# Patient Record
Sex: Male | Born: 1995 | Hispanic: Yes | Marital: Single | Smoking: Former smoker
Health system: Southern US, Community
[De-identification: ages and names within clinical notes are randomized; demographics above are authoritative.]

## PROBLEM LIST (undated history)

## (undated) DIAGNOSIS — K219 Gastro-esophageal reflux disease without esophagitis: Secondary | ICD-10-CM

## (undated) HISTORY — DX: Gastro-esophageal reflux disease without esophagitis: K21.9

---
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
## 2016-02-21 ENCOUNTER — Emergency Department
Admission: EM | Admit: 2016-02-21 | Discharge: 2016-02-21 | Disposition: A | Discharge status: Home / Self Care | Payer: Medicaid Other | Attending: Emergency Medicine | Admitting: Emergency Medicine

## 2016-02-21 ENCOUNTER — Encounter: Payer: Self-pay | Admitting: Emergency Medicine

## 2016-02-21 DIAGNOSIS — Z79899 Other long term (current) drug therapy: Secondary | ICD-10-CM | POA: Diagnosis not present

## 2016-02-21 DIAGNOSIS — J40 Bronchitis, not specified as acute or chronic: Secondary | ICD-10-CM | POA: Diagnosis not present

## 2016-02-21 DIAGNOSIS — R05 Cough: Secondary | ICD-10-CM | POA: Diagnosis present

## 2016-02-21 DIAGNOSIS — F17200 Nicotine dependence, unspecified, uncomplicated: Secondary | ICD-10-CM | POA: Insufficient documentation

## 2016-02-21 DIAGNOSIS — J069 Acute upper respiratory infection, unspecified: Secondary | ICD-10-CM | POA: Insufficient documentation

## 2016-02-21 DIAGNOSIS — Z792 Long term (current) use of antibiotics: Secondary | ICD-10-CM | POA: Insufficient documentation

## 2016-02-21 DIAGNOSIS — B9789 Other viral agents as the cause of diseases classified elsewhere: Secondary | ICD-10-CM

## 2016-02-21 MED ORDER — ALBUTEROL SULFATE HFA 108 (90 BASE) MCG/ACT IN AERS: 2.0000 | INHALATION_SPRAY | Freq: Four times a day (QID) | RESPIRATORY_TRACT | 0 refills | Status: DC | PRN

## 2016-02-21 MED ORDER — FLUTICASONE PROPIONATE 50 MCG/ACT NA SUSP: 2.0000 | Freq: Every day | NASAL | 0 refills | Status: DC

## 2016-02-21 MED ORDER — DEXAMETHASONE SODIUM PHOSPHATE 10 MG/ML IJ SOLN
10.0000 mg | Freq: Once | INTRAMUSCULAR | Status: AC
  Administered 2016-02-21: 10 mg via INTRAMUSCULAR
  Filled 2016-02-21: qty 1

## 2016-02-21 MED ORDER — ALBUTEROL SULFATE (2.5 MG/3ML) 0.083% IN NEBU
5.0000 mg | INHALATION_SOLUTION | Freq: Once | RESPIRATORY_TRACT | Status: AC
  Administered 2016-02-21: 5 mg via RESPIRATORY_TRACT
  Filled 2016-02-21: qty 6

## 2016-02-21 MED ORDER — BENZONATATE 100 MG PO CAPS: 100.0000 mg | ORAL_CAPSULE | Freq: Three times a day (TID) | ORAL | 0 refills | Status: DC | PRN

## 2016-02-21 NOTE — ED Notes (Signed)

## 2016-02-21 NOTE — ED Provider Notes (Signed)
[facility] Emergency Department Provider Note ____________________________________________  Time seen: 2248  I have reviewed the triage vital signs and the nursing notes.  HISTORY  Chief Complaint  Cough and Nasal Congestion  HPI Maxwell Franco is a 20 y.o. male resents to the ED for evaluation of intermittently productive cough and nasal condition for the last 2-3 days. Patient denies any interim fevers, chills, sweats. Denies any sick contacts or recent travel. He described primarily an itchy throat which leads to a cough. He describes also some postnasal drainage but denies any outright sore throat. He describes an intermittent cough with some audible wheezing and mild shortness of breath. He has in the past had an albuterol inhaler but has not had a recent prescription for several years. He denies taking any over-the-counter medications for symptom relief. He denies any other providers for his current complaints.  Past Medical History:  Diagnosis Date  . Gastroesophageal reflux     Patient Active Problem List   Diagnosis Date Noted  . Helicobacter pylori antibody positive 05/15/2012  . Waterbrash 05/15/2012  . Throat pain 05/15/2012  . Gastroesophageal reflux     History reviewed. No pertinent surgical history.  Prior to Admission medications   Medication Sig Start Date End Date Taking? Authorizing Provider  albuterol (PROVENTIL HFA;VENTOLIN HFA) 108 (90 Base) MCG/ACT inhaler Inhale 2 puffs into the lungs every 6 (six) hours as needed for wheezing or shortness of breath. 02/21/16   Kionna Brier V Bacon Paizlee Kinder, PA-C  amoxicillin-clarithromycin-lansoprazole (PREVPAC) combo pack Take by mouth 2 (two) times daily. Follow package directions. 06/09/12 06/23/12  Jon Gills, MD  benzonatate (TESSALON PERLES) 100 MG capsule Take 1 capsule (100 mg total) by mouth 3 (three) times daily as needed for cough (Take 1-2 per dose). 02/21/16   Sydna Brodowski V Bacon Brittyn Salaz, PA-C   fluticasone (FLONASE) 50 MCG/ACT nasal spray Place 2 sprays into both nostrils daily. 02/21/16   Emery Dupuy V Bacon Clemma Johnsen, PA-C  omeprazole (PRILOSEC) 40 MG capsule Take 1 capsule (40 mg total) by mouth daily. 05/15/12   Jon Gills, MD    Allergies Review of patient's allergies indicates no known allergies.  Family History  Problem Relation Age of Onset  . Stomach cancer Neg Hx   . Ulcers Neg Hx   . Cholelithiasis Neg Hx     Social History Social History  Substance Use Topics  . Smoking status: Current Some Day Smoker  . Smokeless tobacco: Never Used  . Alcohol use Not on file    Review of Systems  Constitutional: Negative for fever. Eyes: Negative for visual changes. ENT: Positive for sore throat. Cardiovascular: Negative for chest pain. Respiratory: Positive for shortness of breath and wheezing. Gastrointestinal: Negative for abdominal pain, vomiting and diarrhea. ____________________________________________  PHYSICAL EXAM:  VITAL SIGNS: ED Triage Vitals  Enc Vitals Group     BP 02/21/16 2039 (!) 142/82     Pulse Rate 02/21/16 2039 77     Resp 02/21/16 2039 15     Temp 02/21/16 2039 98.5 F (36.9 C)     Temp Source 02/21/16 2039 Oral     SpO2 02/21/16 2039 99 %     Weight 02/21/16 2040 220 lb (99.8 kg)     Height 02/21/16 2040 5\' 6"  (1.676 m)     Head Circumference --      Peak Flow --      Pain Score --      Pain Loc --  Pain Edu? --      Excl. in GC? --    Constitutional: Alert and oriented. Well appearing and in no distress. Head: Normocephalic and atraumatic. Eyes: Conjunctivae are normal. PERRL. Normal extraocular movements Ears: Canals clear. TMs intact bilaterally. Nose: No congestion/rhinorrhea/epistaxis. Edematous nasal turbinates noted. Mouth/Throat: Mucous membranes are moist. Uvula is midline and tonsils are flat. Generalized oropharyngeal erythema is noted. Neck: Supple. No thyromegaly. Hematological/Lymphatic/Immunological: No cervical  lymphadenopathy. Cardiovascular: Normal rate, regular rhythm. Normal distal pulses. Respiratory: Normal respiratory effort. Mild end expiratory wheezes noted left > right No rales/rhonchi. Skin:  Skin is warm, dry and intact. No rash noted. Psychiatric: Mood and affect are normal. Patient exhibits appropriate insight and judgment. ____________________________________________  PROCEDURES  DuoNeb x 1 Decadron 10 mg IM ____________________________________________  INITIAL IMPRESSION / ASSESSMENT AND PLAN / ED COURSE  Patient with what appears to be an acute viral upper respiratory infection with bronchospasm. He reports improvement after DuoNeb treatment in the ED. She'll be discharged with a prescription for an albuterol inhaler, Tessalon Perles, Flonase nasal spray. He is encouraged to start an over-the-counter allergy medicine for continued symptom relief. She will follow up with his primary care provider or local urgent care for ongoing symptom management. Return precautions are reviewed.  Clinical Course   ____________________________________________  FINAL CLINICAL IMPRESSION(S) / ED DIAGNOSES  Final diagnoses:  Viral URI with cough  Bronchitis      Lissa HoardJenise V Bacon Zephyra Bernardi, PA-C 02/21/16 2325    Minna AntisKevin Paduchowski, MD 02/21/16 2328

## 2016-02-21 NOTE — ED Notes (Signed)
Pt. States stuffy nose, wheezing and productive coughing, itching throat for past 2 days. Worse at night and first thing in am, not as bad throughout day. No home tx reported. denies dizziness, SOB.

## 2016-02-21 NOTE — ED Triage Notes (Signed)
Pt ambulatory to triage with steady gait. No distress noted. Pt c/o productive cough and nasal congestion x3 day. Pt has hx of asthma, non compliant with meds. Inspiratory wheezing heard on assessment.

## 2016-02-21 NOTE — Discharge Instructions (Signed)
Take the prescription meds as directed. Consider starting a daily allergy medicine (Allegra, Claritin, or Zyrtec). Use the inhaler every 4-6 hours as needed for wheeze. Follow-up with your provider or return to the ED for acutely worsening symptoms.

## 2016-05-27 ENCOUNTER — Encounter: Payer: Self-pay | Admitting: Emergency Medicine

## 2016-05-27 ENCOUNTER — Emergency Department
Admission: EM | Admit: 2016-05-27 | Discharge: 2016-05-27 | Disposition: A | Discharge status: Home / Self Care | Payer: Medicaid Other | Attending: Emergency Medicine | Admitting: Emergency Medicine

## 2016-05-27 DIAGNOSIS — R05 Cough: Secondary | ICD-10-CM | POA: Diagnosis present

## 2016-05-27 DIAGNOSIS — Z87891 Personal history of nicotine dependence: Secondary | ICD-10-CM | POA: Insufficient documentation

## 2016-05-27 DIAGNOSIS — Z79899 Other long term (current) drug therapy: Secondary | ICD-10-CM | POA: Diagnosis not present

## 2016-05-27 DIAGNOSIS — J101 Influenza due to other identified influenza virus with other respiratory manifestations: Secondary | ICD-10-CM

## 2016-05-27 DIAGNOSIS — J09X2 Influenza due to identified novel influenza A virus with other respiratory manifestations: Secondary | ICD-10-CM | POA: Insufficient documentation

## 2016-05-27 LAB — INFLUENZA PANEL BY PCR (TYPE A & B)
INFLAPCR: POSITIVE — AB
INFLBPCR: NEGATIVE

## 2016-05-27 MED ORDER — GUAIFENESIN-CODEINE 100-10 MG/5ML PO SOLN: 5.0000 mL | ORAL | 0 refills | Status: DC | PRN

## 2016-05-27 MED ORDER — OSELTAMIVIR PHOSPHATE 75 MG PO CAPS: 75.0000 mg | ORAL_CAPSULE | Freq: Two times a day (BID) | ORAL | 0 refills | Status: AC

## 2016-05-27 NOTE — ED Notes (Signed)
Pt c/o body aches, cough, congestion and headache for the past several days has been around others that have been sick.  Unsure if he got a flu shot. Denies fevers. Has hx of asthma and uses an inhaler.  Mask applied prior to arrival to room.

## 2016-05-27 NOTE — ED Triage Notes (Signed)
Pt c/o body aches, runny nose, congestion, headache, and cough for 2 days. Kid and girlfriend have been sick. No distress.

## 2016-05-27 NOTE — ED Provider Notes (Signed)
[facility] Emergency Department Provider Note  ____________________________________________   First MD Initiated Contact with Patient 05/27/16 [phone]     (approximate)  I have reviewed the triage vital signs and the nursing notes.   HISTORY  Chief Complaint Cough   HPI Maxwell Franco is a 21 y.o. male . Complaint of sudden onset 2 days ago of body aches, runny nose, congestion, and headache and cough. Patient states that girlfriend has been sick with similar symptoms. He has not actually taken his temperature but has felt feverish and occasionally has had chills. There's been no nausea or vomiting. Patient states that he does have a history of asthma and has an inhaler that he uses as needed. He is taken over-the-counter medication with minimal relief of symptoms. He states his throat hurts due to the amount of coughing that he has been doing. Currently he rates his pain as 7 out of 10.   Past Medical History:  Diagnosis Date  . Gastroesophageal reflux     Patient Active Problem List   Diagnosis Date Noted  . Helicobacter pylori antibody positive 05/15/2012  . Waterbrash 05/15/2012  . Throat pain 05/15/2012  . Gastroesophageal reflux     History reviewed. No pertinent surgical history.  Prior to Admission medications   Medication Sig Start Date End Date Taking? Authorizing Provider  albuterol (PROVENTIL HFA;VENTOLIN HFA) 108 (90 Base) MCG/ACT inhaler Inhale 2 puffs into the lungs every 6 (six) hours as needed for wheezing or shortness of breath. 02/21/16   Jenise V Bacon Menshew, PA-C  amoxicillin-clarithromycin-lansoprazole (PREVPAC) combo pack Take by mouth 2 (two) times daily. Follow package directions. 06/09/12 06/23/12  Jon Gills, MD  benzonatate (TESSALON PERLES) 100 MG capsule Take 1 capsule (100 mg total) by mouth 3 (three) times daily as needed for cough (Take 1-2 per dose). 02/21/16   Jenise V Bacon Menshew, PA-C  fluticasone (FLONASE)  50 MCG/ACT nasal spray Place 2 sprays into both nostrils daily. 02/21/16   Jenise V Bacon Menshew, PA-C  guaiFENesin-codeine 100-10 MG/5ML syrup Take 5 mLs by mouth every 4 (four) hours as needed. 05/27/16   Tommi Rumps, PA-C  omeprazole (PRILOSEC) 40 MG capsule Take 1 capsule (40 mg total) by mouth daily. 05/15/12   Jon Gills, MD  oseltamivir (TAMIFLU) 75 MG capsule Take 1 capsule (75 mg total) by mouth 2 (two) times daily. 05/27/16 06/01/16  Tommi Rumps, PA-C    Allergies Patient has no known allergies.  Family History  Problem Relation Age of Onset  . Stomach cancer Neg Hx   . Ulcers Neg Hx   . Cholelithiasis Neg Hx     Social History Social History  Substance Use Topics  . Smoking status: Former Games developer  . Smokeless tobacco: Never Used  . Alcohol use No    Review of Systems Constitutional: Positive fever/chills Eyes: No visual changes. ENT: Positive sore throat. Cardiovascular: Denies chest pain. Respiratory: Denies shortness of breath. Positive cough. Gastrointestinal: No abdominal pain.  No nausea, no vomiting.  No diarrhea.  Musculoskeletal: Positive for generalized body aches. Skin: Negative for rash. Neurological: Positive for headaches, no focal weakness or numbness.  10-point ROS otherwise negative.  ____________________________________________   PHYSICAL EXAM:  VITAL SIGNS: ED Triage Vitals  Enc Vitals Group     BP 05/27/16 0714 (!) 149/85     Pulse Rate 05/27/16 0714 (!) 53     Resp 05/27/16 0714 18     Temp 05/27/16 0714 98.9 F (  37.2 C)     Temp Source 05/27/16 0714 Oral     SpO2 05/27/16 0714 98 %     Weight 05/27/16 0712 225 lb (102.1 kg)     Height 05/27/16 0712 5\' 6"  (1.676 m)     Head Circumference --      Peak Flow --      Pain Score 05/27/16 0712 7     Pain Loc --      Pain Edu? --      Excl. in GC? --     Constitutional: Alert and oriented. Well appearing and in no acute distress. Eyes: Conjunctivae are normal. PERRL.  EOMI. Head: Atraumatic. Nose: Mild congestion/rhinnorhea.  EACs are clear bilaterally. TMs are dull. Mouth/Throat: Mucous membranes are moist.  Oropharynx non-erythematous. Neck: No stridor.   Hematological/Lymphatic/Immunilogical: No cervical lymphadenopathy. Cardiovascular: Normal rate, regular rhythm. Grossly normal heart sounds.  Good peripheral circulation. Respiratory: Normal respiratory effort.  No retractions. Lungs CTAB. Gastrointestinal: Soft and nontender. No distention.  Musculoskeletal: Moves upper and lower extremities without any difficulty. Normal gait was noted. Neurologic:  Normal speech and language. No gross focal neurologic deficits are appreciated. No gait instability. Skin:  Skin is warm, dry and intact. No rash noted. Psychiatric: Mood and affect are normal. Speech and behavior are normal.  ____________________________________________   LABS (all labs ordered are listed, but only abnormal results are displayed)  Labs Reviewed  INFLUENZA PANEL BY PCR (TYPE A & B) - Abnormal; Notable for the following:       Result Value   Influenza A By PCR POSITIVE (*)    All other components within normal limits   _ PROCEDURES  Procedure(s) performed: None  Procedures  Critical Care performed: No  ____________________________________________   INITIAL IMPRESSION / ASSESSMENT AND PLAN / ED COURSE  Pertinent labs & imaging results that were available during my care of the patient were reviewed by me and considered in my medical decision making (see chart for details).  Patient is made aware that he tested positive for influenza A. He is to continue with Tylenol or ibuprofen as needed for muscle aches and fever. He is given a prescription for Robitussin-AC 1 teaspoon every 4 hours as needed for cough. He is aware that he cannot drive or operate machinery while taking this medication. He is also given a prescription for Tamiflu 75 mg 1 twice a day for 5 days. [facility]  [facility] or his primary care doctor if any continued problems.      ____________________________________________   FINAL CLINICAL IMPRESSION(S) / ED DIAGNOSES  Final diagnoses:  Influenza A      NEW MEDICATIONS STARTED DURING THIS VISIT:  Discharge Medication List as of 05/27/2016  8:45 AM    START taking these medications   Details  guaiFENesin-codeine 100-10 MG/5ML syrup Take 5 mLs by mouth every 4 (four) hours as needed., Starting Sun 05/27/2016, Print    oseltamivir (TAMIFLU) 75 MG capsule Take 1 capsule (75 mg total) by mouth 2 (two) times daily., Starting Sun 05/27/2016, Until Fri 06/01/2016, Print         Note:  This document was prepared using Dragon voice recognition software and may include unintentional dictation errors.    Tommi RumpsRhonda L Annick Dimaio, PA-C 05/27/16 0908    Myrna Blazeravid Matthew Schaevitz, MD 05/27/16 [phone]

## 2016-09-04 ENCOUNTER — Emergency Department: Payer: Medicaid Other

## 2016-09-04 ENCOUNTER — Encounter: Payer: Self-pay | Admitting: Medical Oncology

## 2016-09-04 ENCOUNTER — Emergency Department
Admission: EM | Admit: 2016-09-04 | Discharge: 2016-09-04 | Disposition: A | Discharge status: Home / Self Care | Payer: Medicaid Other | Attending: Emergency Medicine | Admitting: Emergency Medicine

## 2016-09-04 DIAGNOSIS — Z87891 Personal history of nicotine dependence: Secondary | ICD-10-CM | POA: Insufficient documentation

## 2016-09-04 DIAGNOSIS — R0789 Other chest pain: Secondary | ICD-10-CM | POA: Insufficient documentation

## 2016-09-04 LAB — CBC
HEMATOCRIT: 45.5 % (ref 40.0–52.0)
HEMOGLOBIN: 15.8 g/dL (ref 13.0–18.0)
MCH: 30.8 pg (ref 26.0–34.0)
MCHC: 34.8 g/dL (ref 32.0–36.0)
MCV: 88.5 fL (ref 80.0–100.0)
Platelets: 203 10*3/uL (ref 150–440)
RBC: 5.13 MIL/uL (ref 4.40–5.90)
RDW: 13 % (ref 11.5–14.5)
WBC: 10.4 10*3/uL (ref 3.8–10.6)

## 2016-09-04 LAB — BASIC METABOLIC PANEL
ANION GAP: 8 (ref 5–15)
BUN: 12 mg/dL (ref 6–20)
CHLORIDE: 103 mmol/L (ref 101–111)
CO2: 25 mmol/L (ref 22–32)
Calcium: 9.2 mg/dL (ref 8.9–10.3)
Creatinine, Ser: 0.67 mg/dL (ref 0.61–1.24)
Glucose, Bld: 150 mg/dL — ABNORMAL HIGH (ref 65–99)
POTASSIUM: 3.5 mmol/L (ref 3.5–5.1)
Sodium: 136 mmol/L (ref 135–145)

## 2016-09-04 LAB — TROPONIN I: Troponin I: <0.03 ng/mL (ref <0.03)

## 2016-09-04 NOTE — ED Provider Notes (Signed)
[facility] Emergency Department Provider Note  Time seen: 7:06 PM  I have reviewed the triage vital signs and the nursing notes.   HISTORY  Chief Complaint Chest Pain    HPI Maxwell Franco is a 21 y.o. male with a past medical history of gastric reflux who presents to the emergency department for central chest pain. According to the patient since awakening this morning he has felt a feeling of discomfort in the central chest much worse with any movement or lifting. Patient states he works in a [employer] and does do heavy lifting on a daily basis with his job but does not recall a specific event which would've triggered his chest pain. Denies any worsening pain with deep inspiration. Denies any leg pain or swelling. Denies any chest pain at rest but states moderate sharp central chest pain with movement. Denies any nausea, dyspnea, or diaphoresis. Denies any cardiac history.  Past Medical History:  Diagnosis Date  . Gastroesophageal reflux     Patient Active Problem List   Diagnosis Date Noted  . Helicobacter pylori antibody positive 05/15/2012  . Waterbrash 05/15/2012  . Throat pain 05/15/2012  . Gastroesophageal reflux     No past surgical history on file.  Prior to Admission medications   Medication Sig Start Date End Date Taking? Authorizing Provider  albuterol (PROVENTIL HFA;VENTOLIN HFA) 108 (90 Base) MCG/ACT inhaler Inhale 2 puffs into the lungs every 6 (six) hours as needed for wheezing or shortness of breath. 02/21/16   Jenise V Bacon Menshew, PA-C  amoxicillin-clarithromycin-lansoprazole (PREVPAC) combo pack Take by mouth 2 (two) times daily. Follow package directions. 06/09/12 06/23/12  Jon Gills, MD  benzonatate (TESSALON PERLES) 100 MG capsule Take 1 capsule (100 mg total) by mouth 3 (three) times daily as needed for cough (Take 1-2 per dose). 02/21/16   Jenise V Bacon Menshew, PA-C  fluticasone (FLONASE) 50 MCG/ACT nasal spray Place 2  sprays into both nostrils daily. 02/21/16   Jenise V Bacon Menshew, PA-C  guaiFENesin-codeine 100-10 MG/5ML syrup Take 5 mLs by mouth every 4 (four) hours as needed. 05/27/16   Tommi Rumps, PA-C  omeprazole (PRILOSEC) 40 MG capsule Take 1 capsule (40 mg total) by mouth daily. 05/15/12   Jon Gills, MD    No Known Allergies  Family History  Problem Relation Age of Onset  . Stomach cancer Neg Hx   . Ulcers Neg Hx   . Cholelithiasis Neg Hx     Social History Social History  Substance Use Topics  . Smoking status: Former Games developer  . Smokeless tobacco: Never Used  . Alcohol use No    Review of Systems Constitutional: Negative for fever. Eyes: Negative for visual changes. ENT: Negative for congestion Cardiovascular: Central chest pain, worse with movement Respiratory: Negative for shortness of breath. Gastrointestinal: Negative for abdominal pain, vomiting  Genitourinary: Negative for dysuria. Musculoskeletal: Negative for back pain. Skin: Negative for rash. Neurological: Negative for headache All other ROS negative  ____________________________________________   PHYSICAL EXAM:  VITAL SIGNS: ED Triage Vitals  Enc Vitals Group     BP 09/04/16 1836 (!) 144/67     Pulse Rate 09/04/16 1836 72     Resp 09/04/16 1836 20     Temp 09/04/16 1836 99 F (37.2 C)     Temp Source 09/04/16 1836 Oral     SpO2 09/04/16 1836 98 %     Weight 09/04/16 1836 225 lb (102.1 kg)     Height  09/04/16 1836  (1.676 m)     Head Circumference --      Peak Flow --      Pain Score 09/04/16 1847 8     Pain Loc --      Pain Edu? --      Excl. in GC? --     Constitutional: Alert and oriented. Well appearing and in no distress. Eyes: Normal exam ENT   Head: Normocephalic and atraumatic   Mouth/Throat: Mucous membranes are moist. Cardiovascular: Normal rate, regular rhythm. No murmur Respiratory: Normal respiratory effort without tachypnea nor retractions. Breath sounds are  clear. Chest is nontender to palpation. Gastrointestinal: Soft and nontender. No distention. Musculoskeletal: Nontender with normal range of motion in all extremities. No lower extremity tenderness or edema. Neurologic:  Normal speech and language. No gross focal neurologic deficits Skin:  Skin is warm, dry and intact.  Psychiatric: Mood and affect are normal. Speech and behavior are normal.   ____________________________________________    EKG  EKG reviewed and interpreted by myself shows normal sinus rhythm at 78 bpm, narrow QRS, normal axis, normal intervals, no concerning ST changes. Normal EKG.  ____________________________________________    RADIOLOGY  Chest x-ray negative  ____________________________________________   INITIAL IMPRESSION / ASSESSMENT AND PLAN / ED COURSE  Pertinent labs & imaging results that were available during my care of the patient were reviewed by me and considered in my medical decision making (see chart for details).  Patient presents to the emergency department for central chest pain worse with movement. No reproducible chest pain with examination however the patient states pain with any twisting movement of the chest. Denies any pleuritic chest pain. No lower extremity edema or tenderness. Denies any associated symptoms such as nausea or diaphoresis or dyspnea. Highly suspect musculoskeletal/chest wall pain. We will check labs as well as a chest x-ray as a precaution. Patient agreeable to plan.  Patient's labs including troponin are normal. Chest x-rays negative. Highly suspect muscular skeletal/chest wall pain.  ____________________________________________   FINAL CLINICAL IMPRESSION(S) / ED DIAGNOSES  Chest pain    Minna Antis, MD 09/04/16 1925

## 2016-09-04 NOTE — ED Triage Notes (Signed)
Pt reports pain to center of chest with movement since this am. Pt denies sob. Pain only with movement.

## 2016-09-13 ENCOUNTER — Emergency Department: Payer: Medicaid Other

## 2016-09-13 ENCOUNTER — Emergency Department
Admission: EM | Admit: 2016-09-13 | Discharge: 2016-09-13 | Disposition: A | Discharge status: Home / Self Care | Payer: Medicaid Other | Attending: Emergency Medicine | Admitting: Emergency Medicine

## 2016-09-13 DIAGNOSIS — R0981 Nasal congestion: Secondary | ICD-10-CM

## 2016-09-13 DIAGNOSIS — J4521 Mild intermittent asthma with (acute) exacerbation: Secondary | ICD-10-CM | POA: Diagnosis not present

## 2016-09-13 DIAGNOSIS — Z87891 Personal history of nicotine dependence: Secondary | ICD-10-CM | POA: Insufficient documentation

## 2016-09-13 MED ORDER — ALBUTEROL SULFATE HFA 108 (90 BASE) MCG/ACT IN AERS: 2.0000 | INHALATION_SPRAY | RESPIRATORY_TRACT | 1 refills | Status: DC | PRN

## 2016-09-13 MED ORDER — ALBUTEROL SULFATE (2.5 MG/3ML) 0.083% IN NEBU
2.5000 mg | INHALATION_SOLUTION | Freq: Once | RESPIRATORY_TRACT | Status: AC
  Administered 2016-09-13: 2.5 mg via RESPIRATORY_TRACT
  Filled 2016-09-13: qty 3

## 2016-09-13 MED ORDER — CETIRIZINE HCL 10 MG PO TABS: 10.0000 mg | ORAL_TABLET | Freq: Every day | ORAL | 0 refills | Status: DC

## 2016-09-13 MED ORDER — METHYLPREDNISOLONE SODIUM SUCC 125 MG IJ SOLR
125.0000 mg | Freq: Once | INTRAMUSCULAR | Status: AC
  Administered 2016-09-13: 125 mg via INTRAMUSCULAR
  Filled 2016-09-13: qty 2

## 2016-09-13 MED ORDER — PREDNISONE 50 MG PO TABS: 50.0000 mg | ORAL_TABLET | Freq: Every day | ORAL | 0 refills | Status: DC

## 2016-09-13 MED ORDER — FLUTICASONE PROPIONATE 50 MCG/ACT NA SUSP: 1.0000 | Freq: Two times a day (BID) | NASAL | 0 refills | Status: DC

## 2016-09-13 NOTE — ED Provider Notes (Signed)
[facility] Emergency Department Provider Note  ____________________________________________  Time seen: Approximately 11:07 PM  I have reviewed the triage vital signs and the nursing notes.   HISTORY  Chief Complaint Nasal Congestion    HPI Maxwell Franco is a 21 y.o. male who presents emergency department complaining of wheezing, coughing, nasal congestion. Patient reports that he has a history of asthma and is out of his albuterol. He reports that over the past 2 nights he has had wheezing and coughing primarily at nighttime. He has also had some mild nasal congestion. He denies any fevers or chills, sore throat, frank difficulty breathing. Patient denies any headache, visual changes, neck pain, chest pain,.pain, nausea or vomiting.   Past Medical History:  Diagnosis Date  . Gastroesophageal reflux     Patient Active Problem List   Diagnosis Date Noted  . Helicobacter pylori antibody positive 05/15/2012  . Waterbrash 05/15/2012  . Throat pain 05/15/2012  . Gastroesophageal reflux     No past surgical history on file.  Prior to Admission medications   Medication Sig Start Date End Date Taking? Authorizing Provider  albuterol (PROVENTIL HFA;VENTOLIN HFA) 108 (90 Base) MCG/ACT inhaler Inhale 2 puffs into the lungs every 4 (four) hours as needed for wheezing or shortness of breath. 09/13/16   Carmeline Kowal, Delorise Royals, PA-C  amoxicillin-clarithromycin-lansoprazole [facility]) combo pack Take by mouth 2 (two) times daily. Follow package directions. 06/09/12 06/23/12  Jon Gills, MD  cetirizine (ZYRTEC) 10 MG tablet Take 1 tablet (10 mg total) by mouth daily. 09/13/16   Yaiza Palazzola, Delorise Royals, PA-C  fluticasone (FLONASE) 50 MCG/ACT nasal spray Place 1 spray into both nostrils 2 (two) times daily. 09/13/16   Epiphany Seltzer, Delorise Royals, PA-C  omeprazole (PRILOSEC) 40 MG capsule Take 1 capsule (40 mg total) by mouth daily. 05/15/12   Jon Gills, MD  predniSONE  (DELTASONE) 50 MG tablet Take 1 tablet (50 mg total) by mouth daily with breakfast. 09/13/16   Shalaunda Weatherholtz, Delorise Royals, PA-C    Allergies Patient has no known allergies.  Family History  Problem Relation Age of Onset  . Stomach cancer Neg Hx   . Ulcers Neg Hx   . Cholelithiasis Neg Hx     Social History Social History  Substance Use Topics  . Smoking status: Former Games developer  . Smokeless tobacco: Never Used  . Alcohol use No     Review of Systems  Constitutional: No fever/chills Eyes: No visual changes. No discharge ENT: Positive for nasal congestion Cardiovascular: no chest pain. Respiratory: Positive cough. Positive for audible wheezing. No SOB. Gastrointestinal: No abdominal pain.  No nausea, no vomiting.  No diarrhea.  No constipation. Musculoskeletal: Negative for musculoskeletal pain. Skin: Negative for rash, abrasions, lacerations, ecchymosis. Neurological: Negative for headaches, focal weakness or numbness. 10-point ROS otherwise negative.  ____________________________________________   PHYSICAL EXAM:  VITAL SIGNS: ED Triage Vitals [09/13/16 2242]  Enc Vitals Group     BP (!) 172/92     Pulse Rate 100     Resp 18     Temp 98.5 F (36.9 C)     Temp Source Oral     SpO2 97 %     Weight 220 lb (99.8 kg)     Height 5\' 6"  (1.676 m)     Head Circumference      Peak Flow      Pain Score      Pain Loc      Pain Edu?  Excl. in GC?      Constitutional: Alert and oriented. Well appearing and in no acute distress. Eyes: Conjunctivae are normal. PERRL. EOMI. Head: Atraumatic. ENT:      Ears: EACs and TMs are unremarkable.      Nose: Mild congestion/rhinnorhea.      Mouth/Throat: Mucous membranes are moist. Pharynx is nonerythematous and nonedematous Neck: No stridor.   Hematological/Lymphatic/Immunilogical: No cervical lymphadenopathy. Cardiovascular: Normal rate, regular rhythm. Normal S1 and S2.  Good peripheral circulation. Respiratory: Normal  respiratory effort without tachypnea or retractions. Lungs with diffuse wheezing right side. No adventitious lung sounds to the left. No rales or rhonchi.Peri Jefferson air entry to the bases with no decreased or absent breath sounds. Musculoskeletal: Full range of motion to all extremities. No gross deformities appreciated. Neurologic:  Normal speech and language. No gross focal neurologic deficits are appreciated.  Skin:  Skin is warm, dry and intact. No rash noted. Psychiatric: Mood and affect are normal. Speech and behavior are normal. Patient exhibits appropriate insight and judgement.   ____________________________________________   LABS (all labs ordered are listed, but only abnormal results are displayed)  Labs Reviewed - No data to display ____________________________________________  EKG   ____________________________________________  RADIOLOGY Festus Barren Aleynah Rocchio, personally viewed and evaluated these images (plain radiographs) as part of my medical decision making, as well as reviewing the written report by the radiologist.  Dg Chest 2 View  Result Date: 09/13/2016 CLINICAL DATA:  21 y/o M; asthma, cough, wheezing in the right lung fields. EXAM: CHEST  2 VIEW COMPARISON:  09/04/2016 chest radiograph FINDINGS: Stable heart size and mediastinal contours are within normal limits. Both lungs are clear. The visualized skeletal structures are unremarkable. IMPRESSION: No active cardiopulmonary disease. Electronically Signed   By: Mitzi Hansen M.D.   On: 09/13/2016 23:32    ____________________________________________    PROCEDURES  Procedure(s) performed:    Procedures    Medications  albuterol (PROVENTIL) (2.5 MG/3ML) 0.083% nebulizer solution 2.5 mg (2.5 mg Nebulization Given 09/13/16 2334)  methylPREDNISolone sodium succinate (SOLU-MEDROL) 125 mg/2 mL injection 125 mg (125 mg Intramuscular Given 09/13/16 2334)      ____________________________________________   INITIAL IMPRESSION / ASSESSMENT AND PLAN / ED COURSE  Pertinent labs & imaging results that were available during my care of the patient were reviewed by me and considered in my medical decision making (see chart for details).  Review of the [place] CSRS was performed in accordance of the [facility] prior to dispensing any controlled drugs.     Patient's diagnosis is consistent with Asthma exacerbation and nasal congestion typically consistent with allergic rhinitis. Patient presented with a two-day history of increasing wheezing, shortness of breath and some nasal congestion. Exam did reveal diffuse wheezing right side lung fields. Chest x-ray reveals no consolidation consistent with pneumonia. Patient had great improvement with albuterol and Solu-Medrol injection.. Patient will be discharged home with prescriptions for albuterol, prednisone, Flonase and Zyrtec. Patient is to follow up with primary care as needed or otherwise directed. Patient is given ED precautions to return to the ED for any worsening or new symptoms.     ____________________________________________  FINAL CLINICAL IMPRESSION(S) / ED DIAGNOSES  Final diagnoses:  Mild intermittent asthma with exacerbation  Nasal congestion      NEW MEDICATIONS STARTED DURING THIS VISIT:  New Prescriptions   ALBUTEROL (PROVENTIL HFA;VENTOLIN HFA) 108 (90 BASE) MCG/ACT INHALER    Inhale 2 puffs into the lungs every 4 (four) hours as needed for wheezing or  shortness of breath.   CETIRIZINE (ZYRTEC) 10 MG TABLET    Take 1 tablet (10 mg total) by mouth daily.   FLUTICASONE (FLONASE) 50 MCG/ACT NASAL SPRAY    Place 1 spray into both nostrils 2 (two) times daily.   PREDNISONE (DELTASONE) 50 MG TABLET    Take 1 tablet (50 mg total) by mouth daily with breakfast.        This chart was dictated using voice recognition software/Dragon. Despite best efforts to proofread, errors can occur  which can change the meaning. Any change was purely unintentional.    Racheal PatchesCuthriell, Taegen Lennox D, PA-C 09/13/16 2346    Phineas SemenGoodman, Graydon, MD 09/13/16 2358

## 2016-09-13 NOTE — ED Triage Notes (Signed)
Pt in with co cold symptoms for 2 days, runny nose and cough. States has hx of asthma and ran out of Inhaler and has been wheezing at home. No shob noted or distress noted at this time.

## 2016-09-13 NOTE — ED Notes (Signed)

## 2018-03-09 ENCOUNTER — Emergency Department
Admission: EM | Admit: 2018-03-09 | Discharge: 2018-03-09 | Disposition: A | Discharge status: Home / Self Care | Payer: Self-pay | Attending: Emergency Medicine | Admitting: Emergency Medicine

## 2018-03-09 ENCOUNTER — Emergency Department: Payer: Self-pay

## 2018-03-09 ENCOUNTER — Other Ambulatory Visit: Payer: Self-pay

## 2018-03-09 DIAGNOSIS — R079 Chest pain, unspecified: Secondary | ICD-10-CM | POA: Insufficient documentation

## 2018-03-09 DIAGNOSIS — Z87891 Personal history of nicotine dependence: Secondary | ICD-10-CM | POA: Insufficient documentation

## 2018-03-09 LAB — CBC
HCT: 44.7 % (ref 39.0–52.0)
HEMOGLOBIN: 15.7 g/dL (ref 13.0–17.0)
MCH: 30.8 pg (ref 26.0–34.0)
MCHC: 35.1 g/dL (ref 30.0–36.0)
MCV: 87.6 fL (ref 80.0–100.0)
PLATELETS: 255 10*3/uL (ref 150–400)
RBC: 5.1 MIL/uL (ref 4.22–5.81)
RDW: 12 % (ref 11.5–15.5)
WBC: 10.2 10*3/uL (ref 4.0–10.5)
nRBC: 0 % (ref 0.0–0.2)

## 2018-03-09 LAB — BASIC METABOLIC PANEL
ANION GAP: 8 (ref 5–15)
BUN: 16 mg/dL (ref 6–20)
CALCIUM: 9.4 mg/dL (ref 8.9–10.3)
CO2: 26 mmol/L (ref 22–32)
Chloride: 106 mmol/L (ref 98–111)
Creatinine, Ser: 0.74 mg/dL (ref 0.61–1.24)
GFR calc Af Amer: >60 mL/min (ref >60)
GFR calc non Af Amer: >60 mL/min (ref >60)
GLUCOSE: 79 mg/dL (ref 70–99)
POTASSIUM: 3.6 mmol/L (ref 3.5–5.1)
SODIUM: 140 mmol/L (ref 135–145)

## 2018-03-09 LAB — TROPONIN I

## 2018-03-09 MED ORDER — ACETAMINOPHEN 500 MG PO TABS
1000.0000 mg | ORAL_TABLET | Freq: Once | ORAL | Status: AC
  Administered 2018-03-09: 1000 mg via ORAL
  Filled 2018-03-09: qty 2

## 2018-03-09 MED ORDER — FAMOTIDINE 20 MG PO TABS: 20.0000 mg | ORAL_TABLET | Freq: Two times a day (BID) | ORAL | 1 refills | Status: DC

## 2018-03-09 MED ORDER — ALBUTEROL SULFATE HFA 108 (90 BASE) MCG/ACT IN AERS: 2.0000 | INHALATION_SPRAY | Freq: Four times a day (QID) | RESPIRATORY_TRACT | 2 refills | Status: DC | PRN

## 2018-03-09 MED ORDER — FAMOTIDINE 20 MG PO TABS
20.0000 mg | ORAL_TABLET | Freq: Once | ORAL | Status: AC
  Administered 2018-03-09: 20 mg via ORAL
  Filled 2018-03-09: qty 1

## 2018-03-09 NOTE — ED Provider Notes (Signed)
[facility] Emergency Department Provider Note       Time seen: ----------------------------------------- 9:13 PM on 03/09/2018 -----------------------------------------   I have reviewed the triage vital signs and the nursing notes.  HISTORY   Chief Complaint Chest Pain    HPI Maxwell Franco is a 22 y.o. male with a history of GERD who presents to the ED for chest pain.  Patient denies any pain at this time, early had left-sided chest pain that has been intermittent for the past several months.  Patient states that the pain came today he got lightheaded.  Sometimes the pain is worse with movements.  Past Medical History:  Diagnosis Date  . Gastroesophageal reflux     Patient Active Problem List   Diagnosis Date Noted  . Helicobacter pylori antibody positive 05/15/2012  . Waterbrash 05/15/2012  . Throat pain 05/15/2012  . Gastroesophageal reflux     History reviewed. No pertinent surgical history.  Allergies Patient has no known allergies.  Social History Social History   Tobacco Use  . Smoking status: Former Games developer  . Smokeless tobacco: Never Used  Substance Use Topics  . Alcohol use: No  . Drug use: Yes    Types: Cocaine   Review of Systems Constitutional: Negative for fever. Cardiovascular: Positive for chest pain Respiratory: Negative for shortness of breath. Gastrointestinal: Negative for abdominal pain, vomiting and diarrhea. Musculoskeletal: Negative for back pain. Skin: Negative for rash. Neurological: Negative for headaches, focal weakness or numbness.  All systems negative/normal/unremarkable except as stated in the HPI  ____________________________________________   PHYSICAL EXAM:  VITAL SIGNS: ED Triage Vitals  Enc Vitals Group     BP 03/09/18 1750 139/82     Pulse Rate 03/09/18 1750 73     Resp 03/09/18 1750 16     Temp 03/09/18 1750 99.1 F (37.3 C)     Temp Source 03/09/18 1750 Oral     SpO2 03/09/18  1750 97 %     Weight 03/09/18 1748 210 lb (95.3 kg)     Height 03/09/18 1748 5\' 5"  (1.651 m)     Head Circumference --      Peak Flow --      Pain Score 03/09/18 1748 0     Pain Loc --      Pain Edu? --      Excl. in GC? --    Constitutional: Alert and oriented. Well appearing and in no distress. Eyes: Conjunctivae are normal. Normal extraocular movements. ENT   Head: Normocephalic and atraumatic.   Nose: No congestion/rhinnorhea.   Mouth/Throat: Mucous membranes are moist.   Neck: No stridor. Cardiovascular: Normal rate, regular rhythm. No murmurs, rubs, or gallops. Respiratory: Normal respiratory effort without tachypnea nor retractions. Breath sounds are clear and equal bilaterally. No wheezes/rales/rhonchi. Gastrointestinal: Soft and nontender. Normal bowel sounds Musculoskeletal: Nontender with normal range of motion in extremities. No lower extremity tenderness nor edema. Neurologic:  Normal speech and language. No gross focal neurologic deficits are appreciated.  Skin:  Skin is warm, dry and intact. No rash noted. Psychiatric: Mood and affect are normal. Speech and behavior are normal.  ____________________________________________  EKG: Interpreted by me.  Sinus rhythm rate 81 QRS, normal PR interval, normal QRS, normal QT  ____________________________________________  ED COURSE:  As part of my medical decision making, I reviewed the following data within the electronic MEDICAL RECORD NUMBER History obtained from family if available, nursing notes, old chart and ekg, as well as notes from prior ED visits.  Patient presented for chest pain, we will assess with labs and imaging as indicated at this time.   Procedures ____________________________________________   LABS (pertinent positives/negatives)  Labs Reviewed  BASIC METABOLIC PANEL  CBC  TROPONIN I    RADIOLOGY Images were viewed by me  Chest x-ray is  unremarkable  ____________________________________________  DIFFERENTIAL DIAGNOSIS   Musculoskeletal pain, GERD, anxiety, unstable angina unlikely  FINAL ASSESSMENT AND PLAN  Nonspecific chest pain   Plan: The patient had presented for chest pain which appears to be likely GERD or musculoskeletal in origin. Patient's labs are reassuring. Patient's imaging is also reassuring.  Patient was started on Pepcid, I will encourage outpatient follow-up as needed.   Ulice Dash, MD   Note: This note was generated in part or whole with voice recognition software. Voice recognition is usually quite accurate but there are transcription errors that can and very often do occur. I apologize for any typographical errors that were not detected and corrected.     Emily Filbert, MD 03/09/18 2140

## 2018-03-09 NOTE — ED Triage Notes (Addendum)
Pt denies pain at this time. States earlier L sided CP, intermittent x few months. When pain came on today states he got lightheaded. States sometimes pain is worse with certain movements, lifts heavy objects at work. States reason he is here is because he had a cold chill come over body that caused him to be SOB.   A&O, ambulatory. No distress noted. Speaking in complete sentences.

## 2018-03-09 NOTE — ED Notes (Addendum)
Dr Williams at bedside 

## 2018-07-21 ENCOUNTER — Emergency Department
Admission: EM | Admit: 2018-07-21 | Discharge: 2018-07-21 | Disposition: A | Discharge status: Home / Self Care | Payer: Self-pay | Attending: Emergency Medicine | Admitting: Emergency Medicine

## 2018-07-21 ENCOUNTER — Encounter: Payer: Self-pay | Admitting: Emergency Medicine

## 2018-07-21 ENCOUNTER — Other Ambulatory Visit: Payer: Self-pay

## 2018-07-21 DIAGNOSIS — Z87891 Personal history of nicotine dependence: Secondary | ICD-10-CM | POA: Insufficient documentation

## 2018-07-21 DIAGNOSIS — J029 Acute pharyngitis, unspecified: Secondary | ICD-10-CM | POA: Insufficient documentation

## 2018-07-21 DIAGNOSIS — Z79899 Other long term (current) drug therapy: Secondary | ICD-10-CM | POA: Insufficient documentation

## 2018-07-21 LAB — GROUP A STREP BY PCR: Group A Strep by PCR: NOT DETECTED

## 2018-07-21 MED ORDER — DIPHENHYDRAMINE HCL 12.5 MG/5ML PO ELIX
12.5000 mg | ORAL_SOLUTION | Freq: Once | ORAL | Status: AC
  Administered 2018-07-21: 12.5 mg via ORAL
  Filled 2018-07-21: qty 5

## 2018-07-21 MED ORDER — LIDOCAINE VISCOUS HCL 2 % MT SOLN
15.0000 mL | Freq: Once | OROMUCOSAL | Status: AC
  Administered 2018-07-21: 15 mL via OROMUCOSAL
  Filled 2018-07-21: qty 15

## 2018-07-21 MED ORDER — MAGIC MOUTHWASH W/LIDOCAINE: 5.0000 mL | Freq: Four times a day (QID) | ORAL | 0 refills | Status: DC

## 2018-07-21 NOTE — ED Triage Notes (Signed)
Sore throat and hurts to swollow for 3 days.  No fver.

## 2018-07-21 NOTE — ED Provider Notes (Signed)
[facility] Emergency Department Provider Note   ____________________________________________   First MD Initiated Contact with Patient 07/21/18 1150     (approximate)  I have reviewed the triage vital signs and the nursing notes.   HISTORY  Chief Complaint Sore Throat    HPI Maxwell Franco is a 23 y.o. male patient complain of sore throat for 3 days.  Patient also complained of nasal congestion nonproductive cough.  Patient denies nausea, vomiting, diarrhea.  Patient still able to tolerate fluids.  Patient rates pain as 8/10.  Patient described the pain is "sore".  No palliative measure for complaint.  Patient has a history of gastroesophageal reflux.      Past Medical History:  Diagnosis Date  . Gastroesophageal reflux     Patient Active Problem List   Diagnosis Date Noted  . Helicobacter pylori antibody positive 05/15/2012  . Waterbrash 05/15/2012  . Throat pain 05/15/2012  . Gastroesophageal reflux     History reviewed. No pertinent surgical history.  Prior to Admission medications   Medication Sig Start Date End Date Taking? Authorizing Provider  albuterol (PROVENTIL HFA;VENTOLIN HFA) 108 (90 Base) MCG/ACT inhaler Inhale 2 puffs into the lungs every 4 (four) hours as needed for wheezing or shortness of breath. 09/13/16   Cuthriell, Delorise Royals, PA-C  albuterol (PROVENTIL HFA;VENTOLIN HFA) 108 (90 Base) MCG/ACT inhaler Inhale 2 puffs into the lungs every 6 (six) hours as needed for wheezing or shortness of breath. 03/09/18   Emily Filbert, MD  famotidine (PEPCID) 20 MG tablet Take 1 tablet (20 mg total) by mouth 2 (two) times daily. 03/09/18   Emily Filbert, MD  fluticasone (FLONASE) 50 MCG/ACT nasal spray Place 1 spray into both nostrils 2 (two) times daily. 09/13/16   Cuthriell, Delorise Royals, PA-C  magic mouthwash w/lidocaine SOLN Take 5 mLs by mouth 4 (four) times daily. 07/21/18   Joni Reining, PA-C  omeprazole (PRILOSEC) 40  MG capsule Take 1 capsule (40 mg total) by mouth daily. 05/15/12   Jon Gills, MD    Allergies Patient has no known allergies.  Family History  Problem Relation Age of Onset  . Stomach cancer Neg Hx   . Ulcers Neg Hx   . Cholelithiasis Neg Hx     Social History Social History   Tobacco Use  . Smoking status: Former Games developer  . Smokeless tobacco: Never Used  Substance Use Topics  . Alcohol use: No  . Drug use: Yes    Types: Cocaine    Review of Systems  Constitutional: No fever/chills Eyes: No visual changes. ENT: Sore throat. Cardiovascular: Denies chest pain. Respiratory: Denies shortness of breath. Gastrointestinal: No abdominal pain.  No nausea, no vomiting.  No diarrhea.  No constipation. Genitourinary: Negative for dysuria. Musculoskeletal: Negative for back pain. Skin: Negative for rash. Neurological: Negative for headaches, focal weakness or numbness.  ____________________________________________   PHYSICAL EXAM:  VITAL SIGNS: ED Triage Vitals [07/21/18 1058]  Enc Vitals Group     BP (!) 162/88     Pulse Rate (!) 102     Resp 16     Temp 98.5 F (36.9 C)     Temp src      SpO2 96 %     Weight 240 lb (108.9 kg)     Height 5\' 6"  (1.676 m)     Head Circumference      Peak Flow      Pain Score 8     Pain  Loc      Pain Edu?      Excl. in GC?     Constitutional: Alert and oriented. Well appearing and in no acute distress. Nose: Edematous nasal turbinates clear rhinorrhea. Mouth/Throat: Mucous membranes are moist.  Oropharynx non-erythematous.  Postnasal drainage. Neck: No stridor.   Hematological/Lymphatic/Immunilogical: No cervical lymphadenopathy. Cardiovascular: Normal rate, regular rhythm. Grossly normal heart sounds.  Good peripheral circulation. Respiratory: Normal respiratory effort.  No retractions. Lungs CTAB. Skin:  Skin is warm, dry and intact. No rash noted. Psychiatric: Mood and affect are normal. Speech and behavior are normal.   ____________________________________________   LABS (all labs ordered are listed, but only abnormal results are displayed)  Labs Reviewed  GROUP A STREP BY PCR   ____________________________________________  EKG   ____________________________________________  RADIOLOGY  ED MD interpretation:    Official radiology report(s): No results found.  ____________________________________________   PROCEDURES  Procedure(s) performed (including Critical Care):  Procedures   ____________________________________________   INITIAL IMPRESSION / ASSESSMENT AND PLAN / ED COURSE  As part of my medical decision making, I reviewed the following data within the electronic MEDICAL RECORD NUMBER         Patient presents with throat and throat and dysphagia for 3 days.  Patient rapid strep test was negative.  Patient physical exam is consistent with viral pharyngitis.  Patient given discharge care instruction advised take medication as directed.  [facility].      ____________________________________________   FINAL CLINICAL IMPRESSION(S) / ED DIAGNOSES  Final diagnoses:  Viral pharyngitis     ED Discharge Orders         Ordered    magic mouthwash w/lidocaine SOLN  4 times daily     07/21/18 1300           Note:  This document was prepared using Dragon voice recognition software and may include unintentional dictation errors.   Joni Reining, PA-C 07/21/18 1304    Don Perking, [place], MD 07/22/18 1525

## 2018-07-21 NOTE — ED Notes (Signed)
See triage note   Developed sore throat for the past 3 days  having increased with swallowing  No fever

## 2018-07-23 ENCOUNTER — Emergency Department
Admission: EM | Admit: 2018-07-23 | Discharge: 2018-07-23 | Disposition: A | Discharge status: Home / Self Care | Payer: Self-pay | Attending: Emergency Medicine | Admitting: Emergency Medicine

## 2018-07-23 ENCOUNTER — Encounter: Payer: Self-pay | Admitting: Emergency Medicine

## 2018-07-23 ENCOUNTER — Other Ambulatory Visit: Payer: Self-pay

## 2018-07-23 DIAGNOSIS — H9203 Otalgia, bilateral: Secondary | ICD-10-CM | POA: Insufficient documentation

## 2018-07-23 DIAGNOSIS — Z87891 Personal history of nicotine dependence: Secondary | ICD-10-CM | POA: Insufficient documentation

## 2018-07-23 DIAGNOSIS — Z79899 Other long term (current) drug therapy: Secondary | ICD-10-CM | POA: Insufficient documentation

## 2018-07-23 DIAGNOSIS — J029 Acute pharyngitis, unspecified: Secondary | ICD-10-CM

## 2018-07-23 DIAGNOSIS — F1410 Cocaine abuse, uncomplicated: Secondary | ICD-10-CM | POA: Insufficient documentation

## 2018-07-23 DIAGNOSIS — H66003 Acute suppurative otitis media without spontaneous rupture of ear drum, bilateral: Secondary | ICD-10-CM | POA: Insufficient documentation

## 2018-07-23 MED ORDER — AMOXICILLIN 500 MG PO CAPS
1000.0000 mg | ORAL_CAPSULE | Freq: Once | ORAL | Status: AC
  Administered 2018-07-23: 1000 mg via ORAL
  Filled 2018-07-23: qty 2

## 2018-07-23 MED ORDER — IBUPROFEN 600 MG PO TABS: 600.0000 mg | ORAL_TABLET | Freq: Four times a day (QID) | ORAL | 0 refills | Status: DC | PRN

## 2018-07-23 MED ORDER — IBUPROFEN 800 MG PO TABS
800.0000 mg | ORAL_TABLET | Freq: Once | ORAL | Status: AC
  Administered 2018-07-23: 800 mg via ORAL
  Filled 2018-07-23: qty 1

## 2018-07-23 MED ORDER — AMOXICILLIN 875 MG PO TABS: 875.0000 mg | ORAL_TABLET | Freq: Two times a day (BID) | ORAL | 0 refills | Status: DC

## 2018-07-23 NOTE — ED Provider Notes (Signed)
[facility] EMERGENCY DEPARTMENT Provider Note   CSN: [id number] Arrival date & time: 07/23/18  1720    History   Chief Complaint Chief Complaint  Patient presents with  . Sore Throat    HPI Maxwell Franco is a 23 y.o. male presents to the emergency department for evaluation of sore throat and bilateral ear pain.  He was seen a couple days ago had PCR strep test that was negative.  He is complaining of continued sore throat pain.  No headaches or fevers.  He has left greater than right ear pain with pressure and no drainage.  He is taken over-the-counter decongestants with occasional ibuprofen.  He denies any chest pain shortness of breath or coughing.  No vomiting or diarrhea.  No rashes.     HPI  Past Medical History:  Diagnosis Date  . Gastroesophageal reflux     Patient Active Problem List   Diagnosis Date Noted  . Helicobacter pylori antibody positive 05/15/2012  . Waterbrash 05/15/2012  . Throat pain 05/15/2012  . Gastroesophageal reflux     History reviewed. No pertinent surgical history.      Home Medications    Prior to Admission medications   Medication Sig Start Date End Date Taking? Authorizing Provider  albuterol (PROVENTIL HFA;VENTOLIN HFA) 108 (90 Base) MCG/ACT inhaler Inhale 2 puffs into the lungs every 4 (four) hours as needed for wheezing or shortness of breath. 09/13/16   Cuthriell, Delorise Royals, PA-C  albuterol (PROVENTIL HFA;VENTOLIN HFA) 108 (90 Base) MCG/ACT inhaler Inhale 2 puffs into the lungs every 6 (six) hours as needed for wheezing or shortness of breath. 03/09/18   Emily Filbert, MD  amoxicillin (AMOXIL) 875 MG tablet Take 1 tablet (875 mg total) by mouth 2 (two) times daily. X 10 days 07/23/18   Evon Slack, PA-C  famotidine (PEPCID) 20 MG tablet Take 1 tablet (20 mg total) by mouth 2 (two) times daily. 03/09/18   Emily Filbert, MD  fluticasone (FLONASE) 50 MCG/ACT nasal spray Place 1 spray into both  nostrils 2 (two) times daily. 09/13/16   Cuthriell, Delorise Royals, PA-C  ibuprofen (ADVIL,MOTRIN) 600 MG tablet Take 1 tablet (600 mg total) by mouth every 6 (six) hours as needed for moderate pain. 07/23/18   Evon Slack, PA-C  magic mouthwash w/lidocaine SOLN Take 5 mLs by mouth 4 (four) times daily. 07/21/18   Joni Reining, PA-C  omeprazole (PRILOSEC) 40 MG capsule Take 1 capsule (40 mg total) by mouth daily. 05/15/12   Jon Gills, MD    Family History Family History  Problem Relation Age of Onset  . Stomach cancer Neg Hx   . Ulcers Neg Hx   . Cholelithiasis Neg Hx     Social History Social History   Tobacco Use  . Smoking status: Former Games developer  . Smokeless tobacco: Never Used  Substance Use Topics  . Alcohol use: No  . Drug use: Yes    Types: Cocaine     Allergies   Patient has no known allergies.   Review of Systems Review of Systems  Constitutional: Negative for fever.  HENT: Positive for congestion, ear pain, rhinorrhea and sore throat. Negative for ear discharge, sinus pressure and sinus pain.   Respiratory: Negative for cough, wheezing and stridor.   Gastrointestinal: Negative for diarrhea, nausea and vomiting.  Musculoskeletal: Negative for arthralgias, myalgias and neck stiffness.  Skin: Negative for rash.  Neurological: Negative for dizziness.     Physical  Exam Updated Vital Signs BP (!) 145/86 (BP Location: Left Arm)   Pulse 90   Temp 98.8 F (37.1 C) (Oral)   Resp 16   Ht  (1.676 m)   Wt 108.9 kg   SpO2 98%   BMI 38.74 kg/m   Physical Exam Constitutional:      Appearance: He is well-developed.  HENT:     Head: Normocephalic and atraumatic.     Comments: Bilateral TMs erythematous and bulging.  TMs are intact.  Canals normal.  No mastoid tenderness.    Right Ear: A middle ear effusion is present. Tympanic membrane is erythematous.     Left Ear: A middle ear effusion is present. Tympanic membrane is erythematous.     Nose: No  congestion.     Mouth/Throat:     Mouth: No oral lesions.     Pharynx: Uvula midline. Posterior oropharyngeal erythema present. No pharyngeal swelling or uvula swelling.     Tonsils: No tonsillar exudate or tonsillar abscesses.  Eyes:     Conjunctiva/sclera: Conjunctivae normal.  Neck:     Musculoskeletal: Normal range of motion.  Cardiovascular:     Rate and Rhythm: Normal rate.     Heart sounds: Normal heart sounds.  Pulmonary:     Effort: Pulmonary effort is normal. No respiratory distress.  Musculoskeletal: Normal range of motion.  Skin:    General: Skin is warm.     Findings: No rash.  Neurological:     Mental Status: He is alert and oriented to person, place, and time.  Psychiatric:        Behavior: Behavior normal.        Thought Content: Thought content normal.      ED Treatments / Results  Labs (all labs ordered are listed, but only abnormal results are displayed) Labs Reviewed - No data to display  EKG None  Radiology No results found.  Procedures Procedures (including critical care time)  Medications Ordered in ED Medications  ibuprofen (ADVIL,MOTRIN) tablet 800 mg (800 mg Oral Given 07/23/18 1758)  amoxicillin (AMOXIL) capsule 1,000 mg (1,000 mg Oral Given 07/23/18 1757)     Initial Impression / Assessment and Plan / ED Course  I have reviewed the triage vital signs and the nursing notes.  Pertinent labs & imaging results that were available during my care of the patient were reviewed by me and considered in my medical decision making (see chart for details).        23 year old male with sore throat and bilateral ear pain with congestion.  On exam today throat is erythematous with no tonsillar exudates or sign of peritonsillar abscess.  Bilateral TMs are erythematous and bulging, consistent with bilateral otitis media without TM rupture.  He is started on amoxicillin.  He is given ibuprofen for pain.  He will alternate Tylenol and ibuprofen at home.   He understands signs symptoms return to ED for.  Final Clinical Impressions(s) / ED Diagnoses   Final diagnoses:  Non-recurrent acute suppurative otitis media of both ears without spontaneous rupture of tympanic membranes  Pharyngitis, unspecified etiology    ED Discharge Orders         Ordered    amoxicillin (AMOXIL) 875 MG tablet  2 times daily     07/23/18 1759    ibuprofen (ADVIL,MOTRIN) 600 MG tablet  Every 6 hours PRN     07/23/18 1759           Evon Slack, PA-C 07/23/18 1808  Minna Antis, MD 07/23/18 2342

## 2018-07-23 NOTE — Discharge Instructions (Addendum)
Please alternate Tylenol and ibuprofen as needed for pain.  Make sure you are drinking lots of fluids.  Take antibiotics as prescribed.  Return to the ER for any worsening symptoms or urgent changes in health.

## 2018-07-23 NOTE — ED Triage Notes (Signed)
Pt presents to ED with c/o sore throat, pt states painful swallowing since Satursday. Pt seen 3/16 for same, pt also c/o bilateral ear pain when swallowing.

## 2018-08-06 ENCOUNTER — Encounter: Payer: Self-pay | Admitting: Emergency Medicine

## 2018-08-06 ENCOUNTER — Emergency Department
Admission: EM | Admit: 2018-08-06 | Discharge: 2018-08-06 | Disposition: A | Discharge status: Home / Self Care | Payer: Self-pay | Attending: Emergency Medicine | Admitting: Emergency Medicine

## 2018-08-06 ENCOUNTER — Other Ambulatory Visit: Payer: Self-pay

## 2018-08-06 DIAGNOSIS — J029 Acute pharyngitis, unspecified: Secondary | ICD-10-CM | POA: Insufficient documentation

## 2018-08-06 DIAGNOSIS — Z87891 Personal history of nicotine dependence: Secondary | ICD-10-CM | POA: Insufficient documentation

## 2018-08-06 MED ORDER — AZITHROMYCIN 250 MG PO TABS: ORAL_TABLET | ORAL | 0 refills | Status: DC

## 2018-08-06 MED ORDER — PREDNISONE 10 MG PO TABS: 30.0000 mg | ORAL_TABLET | Freq: Every day | ORAL | 0 refills | Status: DC

## 2018-08-06 NOTE — Discharge Instructions (Addendum)
Follow up with your regular doctor or return to the ER if not better in 3 to 5 days.  Take the medication as prescribed.  Gargle with warm salt water.

## 2018-08-06 NOTE — ED Provider Notes (Signed)
[facility] Emergency Department Provider Note  ____________________________________________   First MD Initiated Contact with Patient 08/06/18 [phone]     (approximate)  I have reviewed the triage vital signs and the nursing notes.   HISTORY  Chief Complaint Sore Throat    HPI Maxwell Franco is a 23 y.o. male presents to the emergency department complaining of sore throat.  Patient has been here 2 times for the same.  The first time he was told it was a viral pharyngitis.  Second time he was told he had a ear infection.  He was given amoxicillin but states he did not finish the medication because he got water in the pills.  He is unsure of how many were ruined.  He says at least 4-5.  He states he started to feel better but now the symptoms are returning.  He denies any fever, chills, chest pain, shortness of breath.  He denies any vomiting or diarrhea.    Past Medical History:  Diagnosis Date  . Gastroesophageal reflux     Patient Active Problem List   Diagnosis Date Noted  . Helicobacter pylori antibody positive 05/15/2012  . Waterbrash 05/15/2012  . Throat pain 05/15/2012  . Gastroesophageal reflux     History reviewed. No pertinent surgical history.  Prior to Admission medications   Medication Sig Start Date End Date Taking? Authorizing Provider  albuterol (PROVENTIL HFA;VENTOLIN HFA) 108 (90 Base) MCG/ACT inhaler Inhale 2 puffs into the lungs every 6 (six) hours as needed for wheezing or shortness of breath. 03/09/18   Emily Filbert, MD  azithromycin (ZITHROMAX Z-PAK) 250 MG tablet 2 pills today then 1 pill a day for 4 days 08/06/18   Sherrie Mustache Roselyn Bering, PA-C  omeprazole (PRILOSEC) 40 MG capsule Take 1 capsule (40 mg total) by mouth daily. 05/15/12   Jon Gills, MD  predniSONE (DELTASONE) 10 MG tablet Take 3 tablets (30 mg total) by mouth daily with breakfast. 08/06/18   Sherrie Mustache Roselyn Bering, PA-C    Allergies Patient has no known allergies.   Family History  Problem Relation Age of Onset  . Stomach cancer Neg Hx   . Ulcers Neg Hx   . Cholelithiasis Neg Hx     Social History Social History   Tobacco Use  . Smoking status: Former Games developer  . Smokeless tobacco: Never Used  Substance Use Topics  . Alcohol use: No  . Drug use: Yes    Types: Cocaine    Review of Systems  Constitutional: No fever/chills Eyes: No visual changes. ENT: Positive sore throat. Respiratory: Denies cough Genitourinary: Negative for dysuria. Musculoskeletal: Negative for back pain. Skin: Negative for rash.    ____________________________________________   PHYSICAL EXAM:  VITAL SIGNS: ED Triage Vitals  Enc Vitals Group     BP 08/06/18 0848 (!) 150/100     Pulse Rate 08/06/18 0848 88     Resp 08/06/18 0848 18     Temp 08/06/18 0848 98.2 F (36.8 C)     Temp Source 08/06/18 0848 Oral     SpO2 08/06/18 0848 98 %     Weight 08/06/18 0844 240 lb (108.9 kg)     Height 08/06/18 0844  (1.676 m)     Head Circumference --      Peak Flow --      Pain Score 08/06/18 0844 9     Pain Loc --      Pain Edu? --      Excl.  in GC? --     Constitutional: Alert and oriented. Well appearing and in no acute distress. Eyes: Conjunctivae are normal.  Head: Atraumatic. Ears: TMs are still dull with effusions bilaterally, right TM is still red Nose: No congestion/rhinnorhea. Mouth/Throat: Mucous membranes are moist.  Throat appears normal Neck:  supple no lymphadenopathy noted Cardiovascular: Normal rate, regular rhythm. Heart sounds are normal Respiratory: Normal respiratory effort.  No retractions, lungs c t a  GU: deferred Musculoskeletal: FROM all extremities, warm and well perfused Neurologic:  Normal speech and language.  Skin:  Skin is warm, dry and intact. No rash noted. Psychiatric: Mood and affect are normal. Speech and behavior are normal.  ____________________________________________   LABS (all labs ordered are listed, but  only abnormal results are displayed)  Labs Reviewed - No data to display ____________________________________________   ____________________________________________  RADIOLOGY    ____________________________________________   PROCEDURES  Procedure(s) performed: No  Procedures    ____________________________________________   INITIAL IMPRESSION / ASSESSMENT AND PLAN / ED COURSE  Pertinent labs & imaging results that were available during my care of the patient were reviewed by me and considered in my medical decision making (see chart for details).   23 year old male presents emergency department complaining of sore throat.  He has been seen here 2 times for the same.  Once was diagnosed as viral pharyngitis because his strep test was negative.  The next he had a ear infection along with pharyngitis.  He was given amoxicillin but did not finish his prescription.  Physical exam shows patient appears well.  He is afebrile.  TMs have effusions.  Throat appears normal.  Remainder the exam is unremarkable.  Explained findings to the patient.  Explained to him that since he did not finish his medication more than likely the infection has recurred.  Explained how he is supposed to finish his antibiotics and that if he does not it would make him resistant to certain antibiotics.  He states he understands will comply.  He was given a Z-Pak along with prednisone 30 mg daily for 3 days.  He requested a work note and was given 1 for today.  He was discharged in stable condition.    Maxwell Franco was evaluated in Emergency Department on 08/06/2018 for the symptoms described in the history of present illness. He was evaluated in the context of the global COVID-19 pandemic, which necessitated consideration that the patient might be at risk for infection with the SARS-CoV-2 virus that causes COVID-19. Institutional protocols and algorithms that pertain to the evaluation of patients at risk for  COVID-19 are in a state of rapid change based on information released by regulatory bodies including the CDC and federal and state organizations. These policies and algorithms were followed during the patient's care in the ED.    As part of my medical decision making, I reviewed the following data within the electronic MEDICAL RECORD NUMBER Nursing notes reviewed and incorporated, Old chart reviewed, Notes from prior ED visits and [place] Controlled Substance Database  ____________________________________________   FINAL CLINICAL IMPRESSION(S) / ED DIAGNOSES  Final diagnoses:  Acute pharyngitis, unspecified etiology      NEW MEDICATIONS STARTED DURING THIS VISIT:  New Prescriptions   AZITHROMYCIN (ZITHROMAX Z-PAK) 250 MG TABLET    2 pills today then 1 pill a day for 4 days   PREDNISONE (DELTASONE) 10 MG TABLET    Take 3 tablets (30 mg total) by mouth daily with breakfast.     Note:  This document was prepared using Dragon voice recognition software and may include unintentional dictation errors.    Faythe Ghee, PA-C 08/06/18 0920    Nita Sickle, MD 08/06/18 [phone]

## 2018-08-06 NOTE — ED Triage Notes (Signed)
Pt presents to ED via POV with c/o sore throat, pt states has been seen for same. Pt able to maintain all secretions without difficulty. Speaking in complete sentences.

## 2018-10-19 ENCOUNTER — Emergency Department: Payer: Self-pay

## 2018-10-19 ENCOUNTER — Other Ambulatory Visit: Payer: Self-pay

## 2018-10-19 ENCOUNTER — Emergency Department
Admission: EM | Admit: 2018-10-19 | Discharge: 2018-10-19 | Disposition: A | Discharge status: Home / Self Care | Payer: Self-pay | Attending: Emergency Medicine | Admitting: Emergency Medicine

## 2018-10-19 DIAGNOSIS — M5124 Other intervertebral disc displacement, thoracic region: Secondary | ICD-10-CM | POA: Insufficient documentation

## 2018-10-19 DIAGNOSIS — R55 Syncope and collapse: Secondary | ICD-10-CM | POA: Insufficient documentation

## 2018-10-19 DIAGNOSIS — Z23 Encounter for immunization: Secondary | ICD-10-CM | POA: Insufficient documentation

## 2018-10-19 LAB — CBC WITH DIFFERENTIAL/PLATELET
Abs Immature Granulocytes: 0.06 10*3/uL (ref 0.00–0.07)
Basophils Absolute: 0.1 10*3/uL (ref 0.0–0.1)
Basophils Relative: 1 %
Eosinophils Absolute: 0.5 10*3/uL (ref 0.0–0.5)
Eosinophils Relative: 5 %
HCT: 44.4 % (ref 39.0–52.0)
Hemoglobin: 15.7 g/dL (ref 13.0–17.0)
Immature Granulocytes: 1 %
Lymphocytes Relative: 27 %
Lymphs Abs: 2.9 10*3/uL (ref 0.7–4.0)
MCH: 30.4 pg (ref 26.0–34.0)
MCHC: 35.4 g/dL (ref 30.0–36.0)
MCV: 86 fL (ref 80.0–100.0)
Monocytes Absolute: 0.6 10*3/uL (ref 0.1–1.0)
Monocytes Relative: 6 %
Neutro Abs: 6.5 10*3/uL (ref 1.7–7.7)
Neutrophils Relative %: 60 %
Platelets: 226 10*3/uL (ref 150–400)
RBC: 5.16 MIL/uL (ref 4.22–5.81)
RDW: 11.8 % (ref 11.5–15.5)
WBC: 10.6 10*3/uL — ABNORMAL HIGH (ref 4.0–10.5)
nRBC: 0 % (ref 0.0–0.2)

## 2018-10-19 LAB — BASIC METABOLIC PANEL
Anion gap: 13 (ref 5–15)
BUN: 13 mg/dL (ref 6–20)
CO2: 21 mmol/L — ABNORMAL LOW (ref 22–32)
Calcium: 9.3 mg/dL (ref 8.9–10.3)
Chloride: 105 mmol/L (ref 98–111)
Creatinine, Ser: 0.79 mg/dL (ref 0.61–1.24)
GFR calc Af Amer: >60 mL/min (ref >60)
GFR calc non Af Amer: >60 mL/min (ref >60)
Glucose, Bld: 122 mg/dL — ABNORMAL HIGH (ref 70–99)
Potassium: 3.5 mmol/L (ref 3.5–5.1)
Sodium: 139 mmol/L (ref 135–145)

## 2018-10-19 MED ORDER — HYDROMORPHONE HCL 1 MG/ML IJ SOLN
1.0000 mg | Freq: Once | INTRAMUSCULAR | Status: AC
  Administered 2018-10-19: 1 mg via INTRAVENOUS
  Filled 2018-10-19: qty 1

## 2018-10-19 MED ORDER — DEXAMETHASONE SODIUM PHOSPHATE 10 MG/ML IJ SOLN
10.0000 mg | Freq: Once | INTRAMUSCULAR | Status: AC
  Administered 2018-10-19: 12:00:00 10 mg via INTRAVENOUS
  Filled 2018-10-19: qty 1

## 2018-10-19 MED ORDER — KETOROLAC TROMETHAMINE 30 MG/ML IJ SOLN
30.0000 mg | Freq: Once | INTRAMUSCULAR | Status: AC
  Administered 2018-10-19: 11:00:00 30 mg via INTRAVENOUS
  Filled 2018-10-19: qty 1

## 2018-10-19 MED ORDER — HYDROMORPHONE HCL 1 MG/ML IJ SOLN
1.0000 mg | Freq: Once | INTRAMUSCULAR | Status: AC
  Administered 2018-10-19: 12:00:00 1 mg via INTRAVENOUS
  Filled 2018-10-19: qty 1

## 2018-10-19 MED ORDER — OXYCODONE-ACETAMINOPHEN 5-325 MG PO TABS: 1.0000 | ORAL_TABLET | ORAL | 0 refills | Status: DC | PRN

## 2018-10-19 MED ORDER — TETANUS-DIPHTH-ACELL PERTUSSIS 5-2.5-18.5 LF-MCG/0.5 IM SUSP
0.5000 mL | Freq: Once | INTRAMUSCULAR | Status: AC
  Administered 2018-10-19: 11:00:00 0.5 mL via INTRAMUSCULAR
  Filled 2018-10-19: qty 0.5

## 2018-10-19 MED ORDER — PREDNISONE 20 MG PO TABS: 60.0000 mg | ORAL_TABLET | Freq: Every day | ORAL | 0 refills | Status: AC

## 2018-10-19 MED ORDER — SODIUM CHLORIDE 0.9 % IV BOLUS
1000.0000 mL | Freq: Once | INTRAVENOUS | Status: AC
  Administered 2018-10-19: 1000 mL via INTRAVENOUS

## 2018-10-19 MED ORDER — OXYCODONE-ACETAMINOPHEN 5-325 MG PO TABS
2.0000 | ORAL_TABLET | Freq: Once | ORAL | Status: AC
  Administered 2018-10-19: 2 via ORAL
  Filled 2018-10-19: qty 2

## 2018-10-19 MED ORDER — NAPROXEN 500 MG PO TABS: 500.0000 mg | ORAL_TABLET | Freq: Two times a day (BID) | ORAL | 0 refills | Status: DC | PRN

## 2018-10-19 MED ORDER — ONDANSETRON HCL 4 MG/2ML IJ SOLN
4.0000 mg | Freq: Once | INTRAMUSCULAR | Status: AC
  Administered 2018-10-19: 4 mg via INTRAVENOUS

## 2018-10-19 NOTE — Discharge Instructions (Addendum)
Please seek medical attention for any high fevers, chest pain, shortness of breath, change in behavior, persistent vomiting, bloody stool or any other new or concerning symptoms.  

## 2018-10-19 NOTE — ED Notes (Signed)
Patient transported to MRI 

## 2018-10-19 NOTE — ED Notes (Signed)
Pt assisted to ambulation in pt's room. Pt able to tolerate pain/ambulation

## 2018-10-19 NOTE — ED Triage Notes (Signed)
Pt presents via EMS c/o mid back pain while moving furniture. Pt also had syncopal episode following sudden onset of the back pain. Reports bilateral leg tingling that has not resolved. Abrasion noted to back of head.

## 2018-10-19 NOTE — ED Provider Notes (Signed)
[facility] Emergency Department Provider Note  ____________________________________________   First MD Initiated Contact with Patient 10/19/18 1029     (approximate)  I have reviewed the triage vital signs and the nursing notes.   HISTORY  Chief Complaint Back Pain and Loss of Consciousness    HPI Maxwell Termini is a 23 y.o. male with past medical history of reflux here with severe upper back pain.  The patient states that he was lifting a couch into the back of a moving truck today.  He states that he tried to lift it up just enough to get into the truck, when he experienced acute onset of severe, 10 out of 10, stabbing pain in his mid upper back.  He felt like something pulled.  He states that due to the pain, he fell backwards and believes he may have even passed out briefly.  He regained consciousness upon landing on the ground.  He struck the back of his head. Since then, he has had severe pain with any movement of his back, particularly twisting. No alleviating factors. VSS on EMS arrival. No UE or LE weakness, numbness, or tingling. No parethesias. He has a h/o back pain when working but has not been evaluated for it. No loss of bowel or bladder function. No IVDU. No recent fever, chills, or infectious symptoms.     Past Medical History:  Diagnosis Date   Gastroesophageal reflux     Patient Active Problem List   Diagnosis Date Noted   Helicobacter pylori antibody positive 05/15/2012   Waterbrash 05/15/2012   Throat pain 05/15/2012   Gastroesophageal reflux     History reviewed. No pertinent surgical history.  Prior to Admission medications   Medication Sig Start Date End Date Taking? Authorizing Provider  naproxen (NAPROSYN) 500 MG tablet Take 1 tablet (500 mg total) by mouth 2 (two) times daily as needed for moderate pain. 10/19/18 10/19/19  Shaune Pollack, MD  oxyCODONE-acetaminophen (PERCOCET) 5-325 MG tablet Take 1-2 tablets by  mouth every 4 (four) hours as needed for moderate pain or severe pain. 10/19/18 10/19/19  Shaune Pollack, MD  predniSONE (DELTASONE) 20 MG tablet Take 3 tablets (60 mg total) by mouth daily for 5 days. 10/19/18 10/24/18  Shaune Pollack, MD    Allergies Patient has no known allergies.  Family History  Problem Relation Age of Onset   Stomach cancer Neg Hx    Ulcers Neg Hx    Cholelithiasis Neg Hx     Social History Social History   Tobacco Use   Smoking status: Former Smoker   Smokeless tobacco: Never Used  Substance Use Topics   Alcohol use: No   Drug use: Yes    Types: Cocaine    Review of Systems  Review of Systems  Constitutional: Negative for chills, fatigue and fever.  HENT: Negative for congestion and rhinorrhea.   Eyes: Negative for visual disturbance.  Respiratory: Negative for cough and shortness of breath.   Gastrointestinal: Negative for abdominal pain, diarrhea, nausea and vomiting.  Genitourinary: Negative for dysuria and flank pain.  Musculoskeletal: Positive for arthralgias and back pain.  Skin: Negative for rash and wound.  Neurological: Positive for syncope. Negative for weakness and light-headedness.  All other systems reviewed and are negative.    ____________________________________________  PHYSICAL EXAM:      VITAL SIGNS: ED Triage Vitals  Enc Vitals Group     BP      Pulse      Resp  Temp      Temp src      SpO2      Weight      Height      Head Circumference      Peak Flow      Pain Score      Pain Loc      Pain Edu?      Excl. in [place]?      Physical Exam Vitals signs and nursing note reviewed.  Constitutional:      General: He is not in acute distress.    Appearance: He is well-developed.  HENT:     Head: Normocephalic and atraumatic.  Eyes:     Conjunctiva/sclera: Conjunctivae normal.  Neck:     Musculoskeletal: Neck supple.  Cardiovascular:     Rate and Rhythm: Normal rate and regular rhythm.     Heart  sounds: Normal heart sounds. No murmur. No friction rub.     Comments: Pulses 2+ and symmetric bilateral upper and lower extremities. Pulmonary:     Effort: Pulmonary effort is normal. No respiratory distress.     Breath sounds: Normal breath sounds. No wheezing or rales.  Abdominal:     General: There is no distension.     Palpations: Abdomen is soft.     Tenderness: There is no abdominal tenderness.     Comments: Soft, no pulsatile masses.  Musculoskeletal:     Comments: Exquisite tenderness to palpation over her left greater than right paraspinal musculature of the upper thoracic spine.  No midline tenderness.  No midline lower lumbar tenderness.  Skin:    General: Skin is warm.     Capillary Refill: Capillary refill takes less than 2 seconds.  Neurological:     Mental Status: He is alert and oriented to person, place, and time.     Motor: No abnormal muscle tone.     Comments: Strength 5/5 bilateral upper and lower extremities.  Normal sensation to light touch and pinprick bilateral distal upper and lower extremities.  Reflexes 2+ in upper and lower extremities.       ____________________________________________   LABS (all labs ordered are listed, but only abnormal results are displayed)  Labs Reviewed  CBC WITH DIFFERENTIAL/PLATELET - Abnormal; Notable for the following components:      Result Value   WBC 10.6 (*)    All other components within normal limits  BASIC METABOLIC PANEL - Abnormal; Notable for the following components:   CO2 21 (*)    Glucose, Bld 122 (*)    All other components within normal limits    ____________________________________________  EKG: Normal sinus rhythm, VR 67. PR 158, QRS 100, QTc 402. No acute ischemic changes. ________________________________________  RADIOLOGY All imaging, including plain films, CT scans, and ultrasounds, independently reviewed by me, and interpretations confirmed via formal radiology reads.  ED MD interpretation:    Plain films: Negative T-Spine CT: No acute intracranial injury, neg neck MRI: Per below. Diffuse disc disease and herniation. No central cord impingement or edema.  Official radiology report(s): Dg Thoracic Spine 2 View  Result Date: 10/19/2018 CLINICAL DATA:  Thoracic back pain beginning today while moving furniture. Initial encounter. EXAM: THORACIC SPINE 2 VIEWS COMPARISON:  None. FINDINGS: There is no evidence of thoracic spine fracture. Alignment is normal. No other significant bone abnormalities are identified. IMPRESSION: Negative. Electronically Signed   By: Earle Gell M.D.   On: 10/19/2018 11:02   Ct Head Wo Contrast  Result Date: 10/19/2018 CLINICAL DATA:  Syncopal episode, fall, head and neck injury posteriorly EXAM: CT HEAD WITHOUT CONTRAST CT CERVICAL SPINE WITHOUT CONTRAST TECHNIQUE: Multidetector CT imaging of the head and cervical spine was performed following the standard protocol without intravenous contrast. Multiplanar CT image reconstructions of the cervical spine were also generated. COMPARISON:  None. FINDINGS: CT HEAD FINDINGS Brain: No evidence of acute infarction, hemorrhage, hydrocephalus, extra-axial collection or mass lesion/mass effect. Vascular: No hyperdense vessel or unexpected calcification. Skull: Normal. Negative for fracture or focal lesion. Sinuses/Orbits: No acute finding. Other: None. CT CERVICAL SPINE FINDINGS Alignment: Straightened alignment may be positional. Facets aligned. No subluxation or dislocation. Skull base and vertebrae: No acute fracture. No primary bone lesion or focal pathologic process. Soft tissues and spinal canal: No prevertebral fluid or swelling. No visible canal hematoma. Disc levels: Preserved disc spaces and vertebral body heights. No significant degenerative process or spondylosis. Focal kyphosis. Upper chest: Negative. Other: None. IMPRESSION: No acute intracranial abnormality by noncontrast CT. Normal head CT without contrast for  age. Normal cervical spine alignment without acute osseous finding or fracture. Electronically Signed   By: Judie Petit.  Shick M.D.   On: 10/19/2018 11:21   Ct Cervical Spine Wo Contrast  Result Date: 10/19/2018 CLINICAL DATA:  Syncopal episode, fall, head and neck injury posteriorly EXAM: CT HEAD WITHOUT CONTRAST CT CERVICAL SPINE WITHOUT CONTRAST TECHNIQUE: Multidetector CT imaging of the head and cervical spine was performed following the standard protocol without intravenous contrast. Multiplanar CT image reconstructions of the cervical spine were also generated. COMPARISON:  None. FINDINGS: CT HEAD FINDINGS Brain: No evidence of acute infarction, hemorrhage, hydrocephalus, extra-axial collection or mass lesion/mass effect. Vascular: No hyperdense vessel or unexpected calcification. Skull: Normal. Negative for fracture or focal lesion. Sinuses/Orbits: No acute finding. Other: None. CT CERVICAL SPINE FINDINGS Alignment: Straightened alignment may be positional. Facets aligned. No subluxation or dislocation. Skull base and vertebrae: No acute fracture. No primary bone lesion or focal pathologic process. Soft tissues and spinal canal: No prevertebral fluid or swelling. No visible canal hematoma. Disc levels: Preserved disc spaces and vertebral body heights. No significant degenerative process or spondylosis. Focal kyphosis. Upper chest: Negative. Other: None. IMPRESSION: No acute intracranial abnormality by noncontrast CT. Normal head CT without contrast for age. Normal cervical spine alignment without acute osseous finding or fracture. Electronically Signed   By: Judie Petit.  Shick M.D.   On: 10/19/2018 11:21   Mr Thoracic Spine Wo Contrast  Result Date: 10/19/2018 CLINICAL DATA:  Popping sensation in the back while lifting. Syncope and back pain. EXAM: MRI THORACIC AND LUMBAR SPINE WITHOUT CONTRAST TECHNIQUE: Multiplanar and multiecho pulse sequences of the thoracic and lumbar spine were obtained without intravenous  contrast. COMPARISON:  Thoracic spine radiographs from 10/19/2018 FINDINGS: MRI THORACIC SPINE FINDINGS Alignment:  No vertebral subluxation is observed. Vertebrae: Mild disc desiccation at T4-5 and T11-12. Cord:  No significant abnormal spinal cord signal is observed. Paraspinal and other soft tissues: Unremarkable Disc levels: T1-2: Unremarkable. T2-3: No overt impingement although a right paracentral disc protrusion slightly flattens the ventral margin of the cord. T3-4: No impingement although a right lateral recess disc protrusion abuts the right ventral margin of the cord. T4-5: Borderline central narrowing of the thecal sac due to a left paracentral disc protrusion which mildly flattens the left anterior cord. There is plenty of CSF space posterior to the cord. T5-6: Mild central narrowing of the thecal sac due to a left paracentral disc protrusion which mildly flattens the left anterior cord. There is plenty of CSF  space posterior to the cord. T6-7: No impingement. A small right paracentral disc protrusion abuts the anterior margin of the cord. T7-8: Unremarkable. T8-9: No impingement.  Right paracentral disc protrusion. T9-10: Unremarkable. T10-11: Unremarkable. T11-12: No impingement. Broad-based disc bulge with central annular tear. T12-L1: Unremarkable. MRI LUMBAR SPINE FINDINGS Segmentation: The lowest lumbar type non-rib-bearing vertebra is labeled as L5. Alignment:  No vertebral subluxation is observed. Vertebrae:  Mildly congenitally short pedicles. Conus medullaris and cauda equina: Conus extends to the L1 level. Conus and cauda equina appear normal. Paraspinal and other soft tissues: Unremarkable Disc levels: L1-2: Unremarkable. L2-3: Mild central narrowing of the thecal sac due to disc bulge and short pedicles. L3-4: Mild central narrowing of the thecal sac due to disc bulge and short pedicles. L4-5: Borderline left foraminal stenosis due to disc bulge and short pedicles. L5-S1: Unremarkable.  IMPRESSION: MR THORACIC SPINE IMPRESSION 1. Advanced for age degenerative disc disease with multilevel disc protrusions. At T5-6 this results in mild central narrowing of the thecal sac and at T4-5 there is borderline central narrowing of the thecal sac. Disc protrusions at other thoracic levels do not result in impingement. MR LUMBAR SPINE IMPRESSION 1. 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 Electronically Signed   By: Gaylyn RongWalter  Liebkemann M.D.   On: 10/19/2018 14:32   Mr Lumbar Spine Wo Contrast  Result Date: 10/19/2018 CLINICAL DATA:  Popping sensation in the back while lifting. Syncope and back pain. EXAM: MRI THORACIC AND LUMBAR SPINE WITHOUT CONTRAST TECHNIQUE: Multiplanar and multiecho pulse sequences of the thoracic and lumbar spine were obtained without intravenous contrast. COMPARISON:  Thoracic spine radiographs from 10/19/2018 FINDINGS: MRI THORACIC SPINE FINDINGS Alignment:  No vertebral subluxation is observed. Vertebrae: Mild disc desiccation at T4-5 and T11-12. Cord:  No significant abnormal spinal cord signal is observed. Paraspinal and other soft tissues: Unremarkable Disc levels: T1-2: Unremarkable. T2-3: No overt impingement although a right paracentral disc protrusion slightly flattens the ventral margin of the cord. T3-4: No impingement although a right lateral recess disc protrusion abuts the right ventral margin of the cord. T4-5: Borderline central narrowing of the thecal sac due to a left paracentral disc protrusion which mildly flattens the left anterior cord. There is plenty of CSF space posterior to the cord. T5-6: Mild central narrowing of the thecal sac due to a left paracentral disc protrusion which mildly flattens the left anterior cord. There is plenty of CSF space posterior to the cord. T6-7: No impingement. A small right paracentral disc protrusion abuts the anterior margin of the cord. T7-8:  Unremarkable. T8-9: No impingement.  Right paracentral disc protrusion. T9-10: Unremarkable. T10-11: Unremarkable. T11-12: No impingement. Broad-based disc bulge with central annular tear. T12-L1: Unremarkable. MRI LUMBAR SPINE FINDINGS Segmentation: The lowest lumbar type non-rib-bearing vertebra is labeled as L5. Alignment:  No vertebral subluxation is observed. Vertebrae:  Mildly congenitally short pedicles. Conus medullaris and cauda equina: Conus extends to the L1 level. Conus and cauda equina appear normal. Paraspinal and other soft tissues: Unremarkable Disc levels: L1-2: Unremarkable. L2-3: Mild central narrowing of the thecal sac due to disc bulge and short pedicles. L3-4: Mild central narrowing of the thecal sac due to disc bulge and short pedicles. L4-5: Borderline left foraminal stenosis due to disc bulge and short pedicles. L5-S1: Unremarkable. IMPRESSION: MR THORACIC SPINE IMPRESSION 1. Advanced for age degenerative disc disease with multilevel disc protrusions. At T5-6 this results in mild central narrowing  of the thecal sac and at T4-5 there is borderline central narrowing of the thecal sac. Disc protrusions at other thoracic levels do not result in impingement. MR LUMBAR SPINE IMPRESSION 1. 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 Electronically Signed   By: Gaylyn RongWalter  Liebkemann M.D.   On: 10/19/2018 14:32    ____________________________________________  PROCEDURES   Procedure(s) performed (including Critical Care):  Procedures  ____________________________________________  INITIAL IMPRESSION / MDM / ASSESSMENT AND PLAN / ED COURSE  As part of my medical decision making, I reviewed the following data within the electronic MEDICAL RECORD NUMBER Notes from prior ED visits and [place] Controlled Substance Database      *Hassell Donevan Baltazar Sosa was evaluated in Emergency Department on 10/19/2018 for the symptoms  described in the history of present illness. He was evaluated in the context of the global COVID-19 pandemic, which necessitated consideration that the patient might be at risk for infection with the SARS-CoV-2 virus that causes COVID-19. Institutional protocols and algorithms that pertain to the evaluation of patients at risk for COVID-19 are in a state of rapid change based on information released by regulatory bodies including the CDC and federal and state organizations. These policies and algorithms were followed during the patient's care in the ED.  Some ED evaluations and interventions may be delayed as a result of limited staffing during the pandemic.*   Clinical Course as of Oct 19 1642  Sun Oct 19, 2018  [id number] 23 year old male here with acute onset of midthoracic back pain in the setting of lifting a heavy couch.  This is reproduced on exam.  Suspect musculoskeletal pain due to paraspinal strain versus possibly acute disc herniation.  He has no upper extremity or lower extremity numbness, weakness, paresthesias, or evidence to suggest cord compression or acute cord injury.  He has symmetric pulses, normal blood pressure, and no symptoms to suggest aortic dissection.  No risk factors for this.  Will treat symptomatically, obtain imaging in the setting of fall, and reassess.   [CI]  1040 Syncope is likely 2/2 vagal response from pain. EKG non-ischemic, no arrhythmia, with normal intervals. Screening labs sent.   [CI]  1105 Glucose(!): 122 [CI]  1133 Patient now complaining of leg tingling.  Was suspect this is secondary to transient neuropraxia from the fall with possible lateral radicular symptoms, given his severe pain, popping sensation, and lower extremity symptoms, will check an MRI for evaluation of occult cord injury, central herniation, or cauda equina.  He is adamant he has no upper extremity symptoms, no weakness, strength and 5 bilateral upper lower extremities, and I see no evidence of  cervical injury on CT, doubt cervical injury.   [CI]  1156 Feeling better after analgesia.  Will follow-up MRI.  No ectopy or arrhythmia on EKG.   [CI]  1531 MRI reviewed, does show some acute on chronic disc herniation.  No central cord signal.  Case reviewed with Dr. Marcell BarlowYarborough.  Will plan for treatment here with meds and anti-inflammatories.  If he is able to ambulate, can follow-up as an outpatient.  Pain is uncontrolled, admit to medicine.   [CI]    Clinical Course User Index [CI] Shaune PollackIsaacs, Zaria Taha, MD    Medical Decision Making: See above. Back pain 2/2 thoracic disc herniation with syncope from pain/vasovagal. No acute cord edema/injury. NSGY consulted, plan for pain control, outpt follow-up if able to control pain.  ____________________________________________  FINAL CLINICAL  IMPRESSION(S) / ED DIAGNOSES  Final diagnoses:  Thoracic disc herniation  Syncope and collapse     MEDICATIONS GIVEN DURING THIS VISIT:  Medications  HYDROmorphone (DILAUDID) injection 1 mg (1 mg Intravenous Given 10/19/18 1038)  ketorolac (TORADOL) 30 MG/ML injection 30 mg (30 mg Intravenous Given 10/19/18 1120)  Tdap (BOOSTRIX) injection 0.5 mL (0.5 mLs Intramuscular Given 10/19/18 1121)  sodium chloride 0.9 % bolus 1,000 mL (0 mLs Intravenous Stopped 10/19/18 1505)  dexamethasone (DECADRON) injection 10 mg (10 mg Intravenous Given 10/19/18 1141)  HYDROmorphone (DILAUDID) injection 1 mg (1 mg Intravenous Given 10/19/18 1141)  oxyCODONE-acetaminophen (PERCOCET/ROXICET) 5-325 MG per tablet 2 tablet (2 tablets Oral Given 10/19/18 1533)  ondansetron (ZOFRAN) injection 4 mg (4 mg Intravenous Given 10/19/18 1533)     ED Discharge Orders         Ordered    oxyCODONE-acetaminophen (PERCOCET) 5-325 MG tablet  Every 4 hours PRN     10/19/18 1513    predniSONE (DELTASONE) 20 MG tablet  Daily     10/19/18 1513    naproxen (NAPROSYN) 500 MG tablet  2 times daily PRN     10/19/18 1513           Note:  This  document was prepared using Dragon voice recognition software and may include unintentional dictation errors.   Shaune PollackIsaacs, Tivis Wherry, MD 10/19/18 [phone]

## 2018-10-19 NOTE — ED Notes (Signed)
Patient transported to X-ray 

## 2018-10-25 ENCOUNTER — Other Ambulatory Visit: Payer: Self-pay

## 2018-10-25 ENCOUNTER — Emergency Department: Payer: Self-pay

## 2018-10-25 ENCOUNTER — Encounter: Payer: Self-pay | Admitting: Emergency Medicine

## 2018-10-25 ENCOUNTER — Emergency Department
Admission: EM | Admit: 2018-10-25 | Discharge: 2018-10-25 | Disposition: A | Discharge status: Home / Self Care | Payer: Self-pay | Attending: Emergency Medicine | Admitting: Emergency Medicine

## 2018-10-25 DIAGNOSIS — R55 Syncope and collapse: Secondary | ICD-10-CM | POA: Insufficient documentation

## 2018-10-25 DIAGNOSIS — R202 Paresthesia of skin: Secondary | ICD-10-CM | POA: Insufficient documentation

## 2018-10-25 DIAGNOSIS — Z5321 Procedure and treatment not carried out due to patient leaving prior to being seen by health care provider: Secondary | ICD-10-CM | POA: Insufficient documentation

## 2018-10-25 DIAGNOSIS — M549 Dorsalgia, unspecified: Secondary | ICD-10-CM | POA: Insufficient documentation

## 2018-10-25 DIAGNOSIS — R079 Chest pain, unspecified: Secondary | ICD-10-CM | POA: Insufficient documentation

## 2018-10-25 LAB — URINALYSIS, COMPLETE (UACMP) WITH MICROSCOPIC
Bacteria, UA: NONE SEEN
Bilirubin Urine: NEGATIVE
Glucose, UA: NEGATIVE mg/dL
Hgb urine dipstick: NEGATIVE
Ketones, ur: 5 mg/dL — AB
Leukocytes,Ua: NEGATIVE
Nitrite: NEGATIVE
Protein, ur: NEGATIVE mg/dL
Specific Gravity, Urine: 1.019 (ref 1.005–1.030)
pH: 7 (ref 5.0–8.0)

## 2018-10-25 LAB — BASIC METABOLIC PANEL
Anion gap: 12 (ref 5–15)
BUN: 14 mg/dL (ref 6–20)
CO2: 21 mmol/L — ABNORMAL LOW (ref 22–32)
Calcium: 9.4 mg/dL (ref 8.9–10.3)
Chloride: 103 mmol/L (ref 98–111)
Creatinine, Ser: 0.73 mg/dL (ref 0.61–1.24)
GFR calc Af Amer: >60 mL/min (ref >60)
GFR calc non Af Amer: >60 mL/min (ref >60)
Glucose, Bld: 127 mg/dL — ABNORMAL HIGH (ref 70–99)
Potassium: 3.2 mmol/L — ABNORMAL LOW (ref 3.5–5.1)
Sodium: 136 mmol/L (ref 135–145)

## 2018-10-25 LAB — CBC
HCT: 47.4 % (ref 39.0–52.0)
Hemoglobin: 17.1 g/dL — ABNORMAL HIGH (ref 13.0–17.0)
MCH: 30.7 pg (ref 26.0–34.0)
MCHC: 36.1 g/dL — ABNORMAL HIGH (ref 30.0–36.0)
MCV: 85.1 fL (ref 80.0–100.0)
Platelets: 278 10*3/uL (ref 150–400)
RBC: 5.57 MIL/uL (ref 4.22–5.81)
RDW: 11.9 % (ref 11.5–15.5)
WBC: 12.7 10*3/uL — ABNORMAL HIGH (ref 4.0–10.5)
nRBC: 0 % (ref 0.0–0.2)

## 2018-10-25 LAB — TROPONIN I: Troponin I: <0.03 ng/mL (ref <0.03)

## 2018-10-25 NOTE — ED Notes (Signed)
Pts wife brought pt food and drink into ED. Pt was advised not to eat or drink until he sees the doctor.

## 2018-10-25 NOTE — ED Notes (Signed)
Pt called by x-ray, no answer

## 2018-10-25 NOTE — ED Notes (Signed)
Pt not visualized in lobby 

## 2018-10-25 NOTE — ED Triage Notes (Signed)
Pt to ED from home with wife c/o near syncopal episode today while in the car, states also having back and chest pain.  Pt states bilateral hands tingling and cramping.  Presents A&Ox4, speaking in complete and coherent sentences, hyperventilating.

## 2018-10-28 ENCOUNTER — Other Ambulatory Visit: Payer: Self-pay

## 2018-10-28 DIAGNOSIS — F410 Panic disorder [episodic paroxysmal anxiety] without agoraphobia: Secondary | ICD-10-CM | POA: Insufficient documentation

## 2018-10-28 DIAGNOSIS — R0789 Other chest pain: Secondary | ICD-10-CM | POA: Insufficient documentation

## 2018-10-28 DIAGNOSIS — Z87891 Personal history of nicotine dependence: Secondary | ICD-10-CM | POA: Insufficient documentation

## 2018-10-28 LAB — CBC
HCT: 50 % (ref 39.0–52.0)
Hemoglobin: 17.6 g/dL — ABNORMAL HIGH (ref 13.0–17.0)
MCH: 30.4 pg (ref 26.0–34.0)
MCHC: 35.2 g/dL (ref 30.0–36.0)
MCV: 86.5 fL (ref 80.0–100.0)
Platelets: 276 10*3/uL (ref 150–400)
RBC: 5.78 MIL/uL (ref 4.22–5.81)
RDW: 11.9 % (ref 11.5–15.5)
WBC: 13.1 10*3/uL — ABNORMAL HIGH (ref 4.0–10.5)
nRBC: 0 % (ref 0.0–0.2)

## 2018-10-28 LAB — COMPREHENSIVE METABOLIC PANEL
ALT: 38 U/L (ref 0–44)
AST: 27 U/L (ref 15–41)
Albumin: 5.1 g/dL — ABNORMAL HIGH (ref 3.5–5.0)
Alkaline Phosphatase: 73 U/L (ref 38–126)
Anion gap: 10 (ref 5–15)
BUN: 12 mg/dL (ref 6–20)
CO2: 24 mmol/L (ref 22–32)
Calcium: 10 mg/dL (ref 8.9–10.3)
Chloride: 105 mmol/L (ref 98–111)
Creatinine, Ser: 0.72 mg/dL (ref 0.61–1.24)
GFR calc Af Amer: >60 mL/min (ref >60)
GFR calc non Af Amer: >60 mL/min (ref >60)
Glucose, Bld: 107 mg/dL — ABNORMAL HIGH (ref 70–99)
Potassium: 3.8 mmol/L (ref 3.5–5.1)
Sodium: 139 mmol/L (ref 135–145)
Total Bilirubin: 0.9 mg/dL (ref 0.3–1.2)
Total Protein: 8.5 g/dL — ABNORMAL HIGH (ref 6.5–8.1)

## 2018-10-28 LAB — TROPONIN I (HIGH SENSITIVITY): Troponin I (High Sensitivity): <2 ng/L (ref <18)

## 2018-10-28 NOTE — ED Triage Notes (Addendum)
Pt in with co left sided chest pain since yesterday, hx of the same and all tests were wnl. Pt also having palpitation and dizziness. Pt denies any fever, cough, or resp symptoms. Did go to [facility] today for the same and was tested for covid without results.

## 2018-10-29 ENCOUNTER — Emergency Department: Payer: Self-pay

## 2018-10-29 ENCOUNTER — Emergency Department
Admission: EM | Admit: 2018-10-29 | Discharge: 2018-10-29 | Disposition: A | Discharge status: Home / Self Care | Payer: Self-pay | Attending: Emergency Medicine | Admitting: Emergency Medicine

## 2018-10-29 DIAGNOSIS — F410 Panic disorder [episodic paroxysmal anxiety] without agoraphobia: Secondary | ICD-10-CM

## 2018-10-29 DIAGNOSIS — R0789 Other chest pain: Secondary | ICD-10-CM

## 2018-10-29 NOTE — ED Provider Notes (Addendum)
[facility] Emergency Department Provider Note  ____________________________________________   First MD Initiated Contact with Patient 10/29/18 0036     (approximate)  I have reviewed the triage vital signs and the nursing notes.   HISTORY  Chief Complaint Chest Pain    HPI Maxwell Franco is a 23 y.o. male with medical history as listed below who presents  for evaluation of chest pain.  He said that he is has been having episodes of chest pain for at least last few days.  He said he can be at home and for no particular reason has feels like his heart starts to race and he starts feeling hot and sometimes he has sharp pains that feels like needles in the front part of his chest.  He usually goes away within a few minutes.  It is not made worse by anything in particular nor is it relieved by anything in particular.  He does not have any history of heart problems.  He said he has been going through a lot recently, he has been out of a job and just bought a house, he has a lot of bills, and he had a fall for which he was seen in the emergency department a couple of weeks ago and had an extensive work-up that was reassuring.  He went to [facility] earlier today and was sent to the health department for COVID-19 swab and apparently some basic blood work but he does not have the results yet.  He denies fever/chills, sore throat, shortness of breath except when he is having an episode, cough, nausea, vomiting, and abdominal pain.  He has not been around anyone known to have COVID-19.  He does not have a history of hypertension, diabetes, he does not smoke tobacco although he used to, and he has no first-degree family members who have had heart attacks.  He describes the symptoms as acute in onset and severe when they happen.      Past Medical History:  Diagnosis Date  . Gastroesophageal reflux     Patient Active Problem List   Diagnosis Date Noted  .  Helicobacter pylori antibody positive 05/15/2012  . Waterbrash 05/15/2012  . Throat pain 05/15/2012  . Gastroesophageal reflux     No past surgical history on file.  Prior to Admission medications   Medication Sig Start Date End Date Taking? Authorizing Provider  naproxen (NAPROSYN) 500 MG tablet Take 1 tablet (500 mg total) by mouth 2 (two) times daily as needed for moderate pain. 10/19/18 10/19/19  Shaune PollackIsaacs, Cameron, MD  oxyCODONE-acetaminophen (PERCOCET) 5-325 MG tablet Take 1-2 tablets by mouth every 4 (four) hours as needed for moderate pain or severe pain. 10/19/18 10/19/19  Shaune PollackIsaacs, Cameron, MD    Allergies Patient has no known allergies.  Family History  Problem Relation Age of Onset  . Stomach cancer Neg Hx   . Ulcers Neg Hx   . Cholelithiasis Neg Hx     Social History Social History   Tobacco Use  . Smoking status: Former Games developermoker  . Smokeless tobacco: Never Used  Substance Use Topics  . Alcohol use: No  . Drug use: Yes    Types: Cocaine    Review of Systems Constitutional: No fever/chills Eyes: No visual changes. ENT: No sore throat. Cardiovascular: Chest pain as described above with occasional palpitations. Respiratory: Shortness of breath occasionally associated with chest pain Gastrointestinal: No abdominal pain.  No nausea, no vomiting.  No diarrhea.  No constipation. Genitourinary:  Negative for dysuria. Musculoskeletal: Negative for neck pain.  Negative for back pain. Integumentary: Negative for rash. Neurological: Negative for headaches, focal weakness or numbness.   ____________________________________________   PHYSICAL EXAM:  VITAL SIGNS: ED Triage Vitals  Enc Vitals Group     BP 10/28/18 2057 (!) 165/113     Pulse Rate 10/28/18 2057 94     Resp 10/28/18 2057 20     Temp 10/28/18 2057 100.2 F (37.9 C)     Temp Source 10/28/18 2057 Oral     SpO2 10/28/18 2057 98 %     Weight 10/28/18 2058 104.3 kg (230 lb)     Height 10/28/18 2058 1.676 m  (5\' 6" )     Head Circumference --      Peak Flow --      Pain Score 10/28/18 2058 5     Pain Loc --      Pain Edu? --      Excl. in [place]? --     Constitutional: Alert and oriented. Well appearing and in no acute distress. Eyes: Conjunctivae are normal.  Head: Atraumatic. Nose: No congestion/rhinnorhea. Mouth/Throat: Mucous membranes are moist. Neck: No stridor.  No meningeal signs.   Cardiovascular: Normal rate, regular rhythm. Good peripheral circulation. Grossly normal heart sounds. Respiratory: Normal respiratory effort.  No retractions. No audible wheezing. Gastrointestinal: Soft and nontender. No distention.  Musculoskeletal: No lower extremity tenderness nor edema. No gross deformities of extremities. Neurologic:  Normal speech and language. No gross focal neurologic deficits are appreciated.  Skin:  Skin is warm, dry and intact. No rash noted. Psychiatric: Mood and affect are anxious but generally normal under the circumstances.  No concerning psychiatric findings.  ____________________________________________   LABS (all labs ordered are listed, but only abnormal results are displayed)  Labs Reviewed  CBC - Abnormal; Notable for the following components:      Result Value   WBC 13.1 (*)    Hemoglobin 17.6 (*)    All other components within normal limits  COMPREHENSIVE METABOLIC PANEL - Abnormal; Notable for the following components:   Glucose, Bld 107 (*)    Total Protein 8.5 (*)    Albumin 5.1 (*)    All other components within normal limits  TROPONIN I (HIGH SENSITIVITY)   ____________________________________________  EKG  ED ECG REPORT I, Hinda Kehr, the attending physician, personally viewed and interpreted this ECG.  Date: 10/28/2018 EKG Time: 21: 00 Rate: 87 Rhythm: normal sinus rhythm QRS Axis: normal Intervals: normal ST/T Wave abnormalities: normal Narrative Interpretation: no evidence of acute ischemia   ____________________________________________  RADIOLOGY I, Hinda Kehr, personally viewed and evaluated these images (plain radiographs) as part of my medical decision making, as well as reviewing the written report by the radiologist.  ED MD interpretation: Normal mediastinum, no active cardiopulmonary disease.  Official radiology report(s): Dg Chest 2 View  Result Date: 10/29/2018 CLINICAL DATA:  23 year old male with chest pain. EXAM: CHEST - 2 VIEW COMPARISON:  Chest radiograph dated 03/09/2018 FINDINGS: The heart size and mediastinal contours are within normal limits. Both lungs are clear. The visualized skeletal structures are unremarkable. IMPRESSION: No active cardiopulmonary disease. Electronically Signed   By: Anner Crete M.D.   On: 10/29/2018 00:46    ____________________________________________   PROCEDURES   Procedure(s) performed (including Critical Care):  Procedures   ____________________________________________   INITIAL IMPRESSION / MDM / [place] / ED COURSE  As part of my medical decision making, I reviewed the following data within  the electronic MEDICAL RECORD NUMBER Nursing notes reviewed and incorporated, Labs reviewed , EKG interpreted , Old chart reviewed, Radiograph reviewed , Notes from prior ED visits and [place] Controlled Substance Database    *Maxwell Franco was evaluated in Emergency Department on 10/29/2018 for the symptoms described in the history of present illness. He was evaluated in the context of the global COVID-19 pandemic, which necessitated consideration that the patient might be at risk for infection with the SARS-CoV-2 virus that causes COVID-19. Institutional protocols and algorithms that pertain to the evaluation of patients at risk for COVID-19 are in a state of rapid change based on information released by regulatory bodies including the CDC and federal and state organizations. These policies and algorithms were followed  during the patient's care in the ED.  Some ED evaluations and interventions may be delayed as a result of limited staffing during the pandemic.*  Differential diagnosis includes, but is not limited to, panic/anxiety attacks, musculoskeletal pain, pneumonia, COVID-19, much less likely ACS, aortic dissection, or PE.  The patient is PERC negative.  HEAR Score: 0 .  He describes the difficult financial issues in his new house, his recent fall, etc. and he obviously is very anxious and worried.  He has a high-sensitivity troponin of less than 2, nonspecific leukocytosis of 13, normal comprehensive metabolic panel, no acute changes on EKG, normal chest x-ray.  I spent some time talking with the patient about the role anxiety and panic attacks are playing.  He was unfamiliar with this concept and I talked to him a little bit about panic attacks and why I believe this is what is causing his issues.  I strongly encouraged him to follow-up with [facility].  I also gave him information about a cardiologist with whom he can follow-up but I encouraged him to put his financial resources into [facility] to help with his symptoms.  I provided some outpatient resources for him to read and he says that he understands and agrees with the plan.  There is no indication for repeat troponin testing at this time.  Clinical Course as of Oct 28 137  Wed Oct 29, 2018  0138 Of note, the patient's temperature was slightly elevated at 100.2 but he was tested as an outpatient for COVID-19 and there is no indication according to our current protocols to test someone who is asymptomatic and does not require admission.  He can await the results of his outpatient testing.   [CF]    Clinical Course User Index [CF] Loleta RoseForbach, Anusha Claus, MD     ____________________________________________  FINAL CLINICAL IMPRESSION(S) / ED DIAGNOSES  Final diagnoses:  Atypical chest pain  Panic attacks     MEDICATIONS GIVEN DURING THIS  VISIT:  Medications - No data to display   ED Discharge Orders    None       Note:  This document was prepared using Dragon voice recognition software and may include unintentional dictation errors.   Loleta RoseForbach, Aireonna Bauer, MD 10/29/18 [id number]    Loleta RoseForbach, Migel Hannis, MD 10/29/18 [id number]

## 2018-10-29 NOTE — Discharge Instructions (Signed)
You have been seen in the Emergency Department (ED) today for chest pain.  As we have discussed todays test results are normal, and we feel it is likely that panic attacks may be causing your symptoms.  Please follow up with the recommended doctor as instructed above in these documents regarding todays emergent visit and your recent symptoms to discuss further management.    Return to the Emergency Department (ED) if you experience any further chest pain/pressure/tightness, difficulty breathing, or sudden sweating, or other symptoms that concern you.

## 2018-10-29 NOTE — ED Notes (Signed)
C/o chest pain on the left side, been seen before at [facility] for the same and had blood work done, pt states feels like his heart is racing too when he stands up and moves around. No distress noted. Pt waiting for COVID-19 test results from the health department.

## 2018-11-10 ENCOUNTER — Emergency Department
Admission: EM | Admit: 2018-11-10 | Discharge: 2018-11-10 | Disposition: A | Discharge status: Home / Self Care | Payer: Self-pay | Attending: Emergency Medicine | Admitting: Emergency Medicine

## 2018-11-10 ENCOUNTER — Emergency Department: Payer: Self-pay

## 2018-11-10 ENCOUNTER — Encounter: Payer: Self-pay | Admitting: Emergency Medicine

## 2018-11-10 ENCOUNTER — Other Ambulatory Visit: Payer: Self-pay

## 2018-11-10 DIAGNOSIS — Y999 Unspecified external cause status: Secondary | ICD-10-CM | POA: Insufficient documentation

## 2018-11-10 DIAGNOSIS — Z87891 Personal history of nicotine dependence: Secondary | ICD-10-CM | POA: Insufficient documentation

## 2018-11-10 DIAGNOSIS — S39012A Strain of muscle, fascia and tendon of lower back, initial encounter: Secondary | ICD-10-CM | POA: Insufficient documentation

## 2018-11-10 DIAGNOSIS — Y929 Unspecified place or not applicable: Secondary | ICD-10-CM | POA: Insufficient documentation

## 2018-11-10 DIAGNOSIS — Y939 Activity, unspecified: Secondary | ICD-10-CM | POA: Insufficient documentation

## 2018-11-10 DIAGNOSIS — X500XXA Overexertion from strenuous movement or load, initial encounter: Secondary | ICD-10-CM | POA: Insufficient documentation

## 2018-11-10 MED ORDER — LIDOCAINE 5 % EX PTCH
1.0000 | MEDICATED_PATCH | CUTANEOUS | Status: DC
  Administered 2018-11-10: 1 via TRANSDERMAL
  Filled 2018-11-10: qty 1

## 2018-11-10 MED ORDER — TRAMADOL HCL 50 MG PO TABS: 50.0000 mg | ORAL_TABLET | Freq: Four times a day (QID) | ORAL | 0 refills | Status: AC | PRN

## 2018-11-10 MED ORDER — IBUPROFEN 600 MG PO TABS: 600.0000 mg | ORAL_TABLET | Freq: Three times a day (TID) | ORAL | 0 refills | Status: AC | PRN

## 2018-11-10 MED ORDER — CYCLOBENZAPRINE HCL 10 MG PO TABS: 10.0000 mg | ORAL_TABLET | Freq: Three times a day (TID) | ORAL | 0 refills | Status: AC | PRN

## 2018-11-10 NOTE — ED Triage Notes (Signed)
Pt reports injured back by heavy lifting a few weeks ago and it is still hurting. Denies re-injury.

## 2018-11-10 NOTE — ED Notes (Signed)
See triage note  States he lifted furniture about 3 weeks ago   And fell backwards  Was seen at that time  But conts to have  Mid to lower back pain  Ambulates with slight limp d/t pain

## 2018-11-10 NOTE — ED Provider Notes (Signed)
[facility] Emergency Department Provider Note   ____________________________________________   First MD Initiated Contact with Patient 11/10/18 [phone]     (approximate)  I have reviewed the triage vital signs and the nursing notes.   HISTORY  Chief Complaint Back Pain    HPI Maxwell Franco is a 23 y.o. male patient complain low back pain for 3 weeks secondary to heavy lifting incident.  Patient denies radicular component to his back pain.  Patient denies bladder bowel dysfunction.  Patient described the pain is "sharp".  Patient rates the pain as a 10/10.  No relief over-the-counter anti-inflammatory medications.  Patient the pain increased with flexion and lifting.         Past Medical History:  Diagnosis Date  . Gastroesophageal reflux     Patient Active Problem List   Diagnosis Date Noted  . Helicobacter pylori antibody positive 05/15/2012  . Waterbrash 05/15/2012  . Throat pain 05/15/2012  . Gastroesophageal reflux     History reviewed. No pertinent surgical history.  Prior to Admission medications   Medication Sig Start Date End Date Taking? Authorizing Provider  cyclobenzaprine (FLEXERIL) 10 MG tablet Take 1 tablet (10 mg total) by mouth 3 (three) times daily as needed. 11/10/18   Joni ReiningSmith, Ronald K, PA-C  ibuprofen (ADVIL) 600 MG tablet Take 1 tablet (600 mg total) by mouth every 8 (eight) hours as needed. 11/10/18   Joni ReiningSmith, Ronald K, PA-C  traMADol (ULTRAM) 50 MG tablet Take 1 tablet (50 mg total) by mouth every 6 (six) hours as needed. 11/10/18 11/10/19  Joni ReiningSmith, Ronald K, PA-C    Allergies Patient has no known allergies.  Family History  Problem Relation Age of Onset  . Stomach cancer Neg Hx   . Ulcers Neg Hx   . Cholelithiasis Neg Hx     Social History Social History   Tobacco Use  . Smoking status: Former Games developermoker  . Smokeless tobacco: Never Used  Substance Use Topics  . Alcohol use: No  . Drug use: Yes    Types: Cocaine     Review of Systems Constitutional: No fever/chills Eyes: No visual changes. ENT: No sore throat. Cardiovascular: Denies chest pain. Respiratory: Denies shortness of breath. Gastrointestinal: No abdominal pain.  No nausea, no vomiting.  No diarrhea.  No constipation. Genitourinary: Negative for dysuria. Musculoskeletal: Positive for back pain. Skin: Negative for rash. Neurological: Negative for headaches, focal weakness or numbness.   ____________________________________________   PHYSICAL EXAM:  VITAL SIGNS: ED Triage Vitals  Enc Vitals Group     BP 11/10/18 0934 (!) 154/88     Pulse Rate 11/10/18 0934 87     Resp 11/10/18 0934 20     Temp 11/10/18 0934 99.7 F (37.6 C)     Temp Source 11/10/18 0934 Oral     SpO2 11/10/18 0934 98 %     Weight 11/10/18 0935 211 lb (95.7 kg)     Height 11/10/18 0935 5\' 5"  (1.651 m)     Head Circumference --      Peak Flow --      Pain Score 11/10/18 0934 10     Pain Loc --      Pain Edu? --      Excl. in [place]? --    Constitutional: Alert and oriented. Well appearing and in no acute distress.  Neck:No cervical spine tenderness to palpation. Hematological/Lymphatic/Immunilogical: No cervical lymphadenopathy. Cardiovascular: Normal rate, regular rhythm. Grossly normal heart sounds.  Good peripheral circulation. Respiratory: Normal  respiratory effort.  No retractions. Lungs CTAB. Gastrointestinal: Soft and nontender. No distention. No abdominal bruits. No CVA tenderness. Genitourinary: Deferred Musculoskeletal: Patient says since then her reliance on upper extremity.  No obvious lumbar spine deformity.  Patient has moderate guarding palpation of L3-S1.  Patient decreased range of motion with flexion.  No lower extremity tenderness nor edema.  Patient had bilateral negative straight leg test. Neurologic:  Normal speech and language. No gross focal neurologic deficits are appreciated. No gait instability. Skin:  Skin is warm, dry and intact. No  rash noted. Psychiatric: Mood and affect are normal. Speech and behavior are normal.  ____________________________________________   LABS (all labs ordered are listed, but only abnormal results are displayed)  Labs Reviewed - No data to display ____________________________________________  EKG   ____________________________________________  RADIOLOGY  ED MD interpretation:    Official radiology report(s): Dg Lumbar Spine 2-3 Views  Result Date: 11/10/2018 CLINICAL DATA:  Pt reports injured back by heavy lifting a few weeks ago and it is still hurting. Denies re-injury EXAM: LUMBAR SPINE - 2-3 VIEW COMPARISON:  None. FINDINGS: There is no evidence of lumbar spine fracture. Mild levoscoliosis which may be related to patient positioning. Intervertebral disc spaces are maintained. Visualized paravertebral soft tissues are unremarkable. IMPRESSION: Negative. Electronically Signed   By: Franki Cabot M.D.   On: 11/10/2018 10:14    ____________________________________________   PROCEDURES  Procedure(s) performed (including Critical Care):  Procedures   ____________________________________________   INITIAL IMPRESSION / ASSESSMENT AND PLAN / ED COURSE  As part of my medical decision making, I reviewed the following data within the electronic MEDICAL RECORD NUMBER         Maxwell Franco was evaluated in Emergency Department on 11/10/2018 for the symptoms described in the history of present illness. He was evaluated in the context of the global COVID-19 pandemic, which necessitated consideration that the patient might be at risk for infection with the SARS-CoV-2 virus that causes COVID-19. Institutional protocols and algorithms that pertain to the evaluation of patients at risk for COVID-19 are in a state of rapid change based on information released by regulatory bodies including the CDC and federal and state organizations. These policies and algorithms were followed during the  patient's care in the ED.   Patient presents with low back pain secondary to heaviness incident 3 weeks ago.  Patient denies radicular component to his pain.  Discussed neck x-ray findings with patient.  Patient given discharge care instruction advised take medication as directed.  Patient advised follow-up PCP.      ____________________________________________   FINAL CLINICAL IMPRESSION(S) / ED DIAGNOSES  Final diagnoses:  Strain of lumbar region, initial encounter     ED Discharge Orders         Ordered    traMADol (ULTRAM) 50 MG tablet  Every 6 hours PRN     11/10/18 1025    cyclobenzaprine (FLEXERIL) 10 MG tablet  3 times daily PRN     11/10/18 1025    ibuprofen (ADVIL) 600 MG tablet  Every 8 hours PRN     11/10/18 1025           Note:  This document was prepared using Dragon voice recognition software and may include unintentional dictation errors.    Sable Feil, PA-C 11/10/18 1026    Harvest Dark, MD 11/10/18 1445

## 2020-08-23 IMAGING — CR THORACIC SPINE 2 VIEWS
3 series · 3 of 3 positions shown · non-contrast
Comparison: None.

CLINICAL DATA: Thoracic back pain beginning today while moving
furniture. Initial encounter.

EXAM:
THORACIC SPINE 2 VIEWS

[t-spine ap]
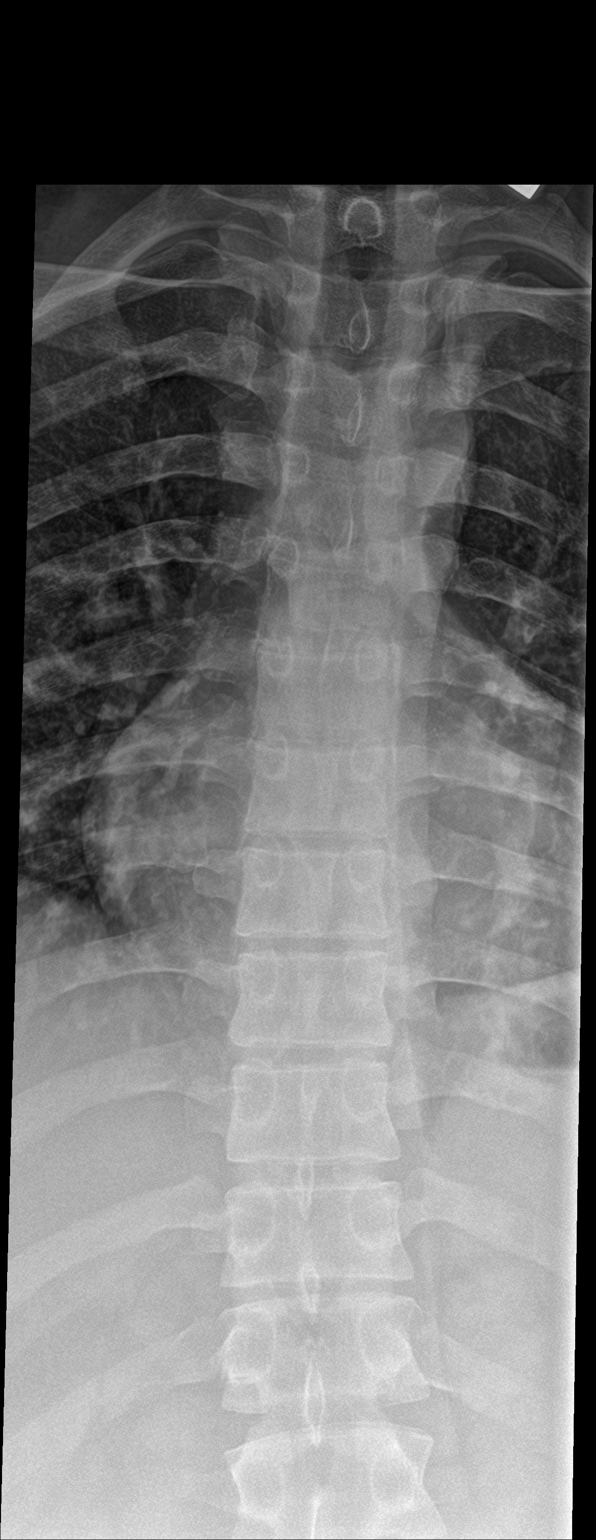

[t-spine lat]
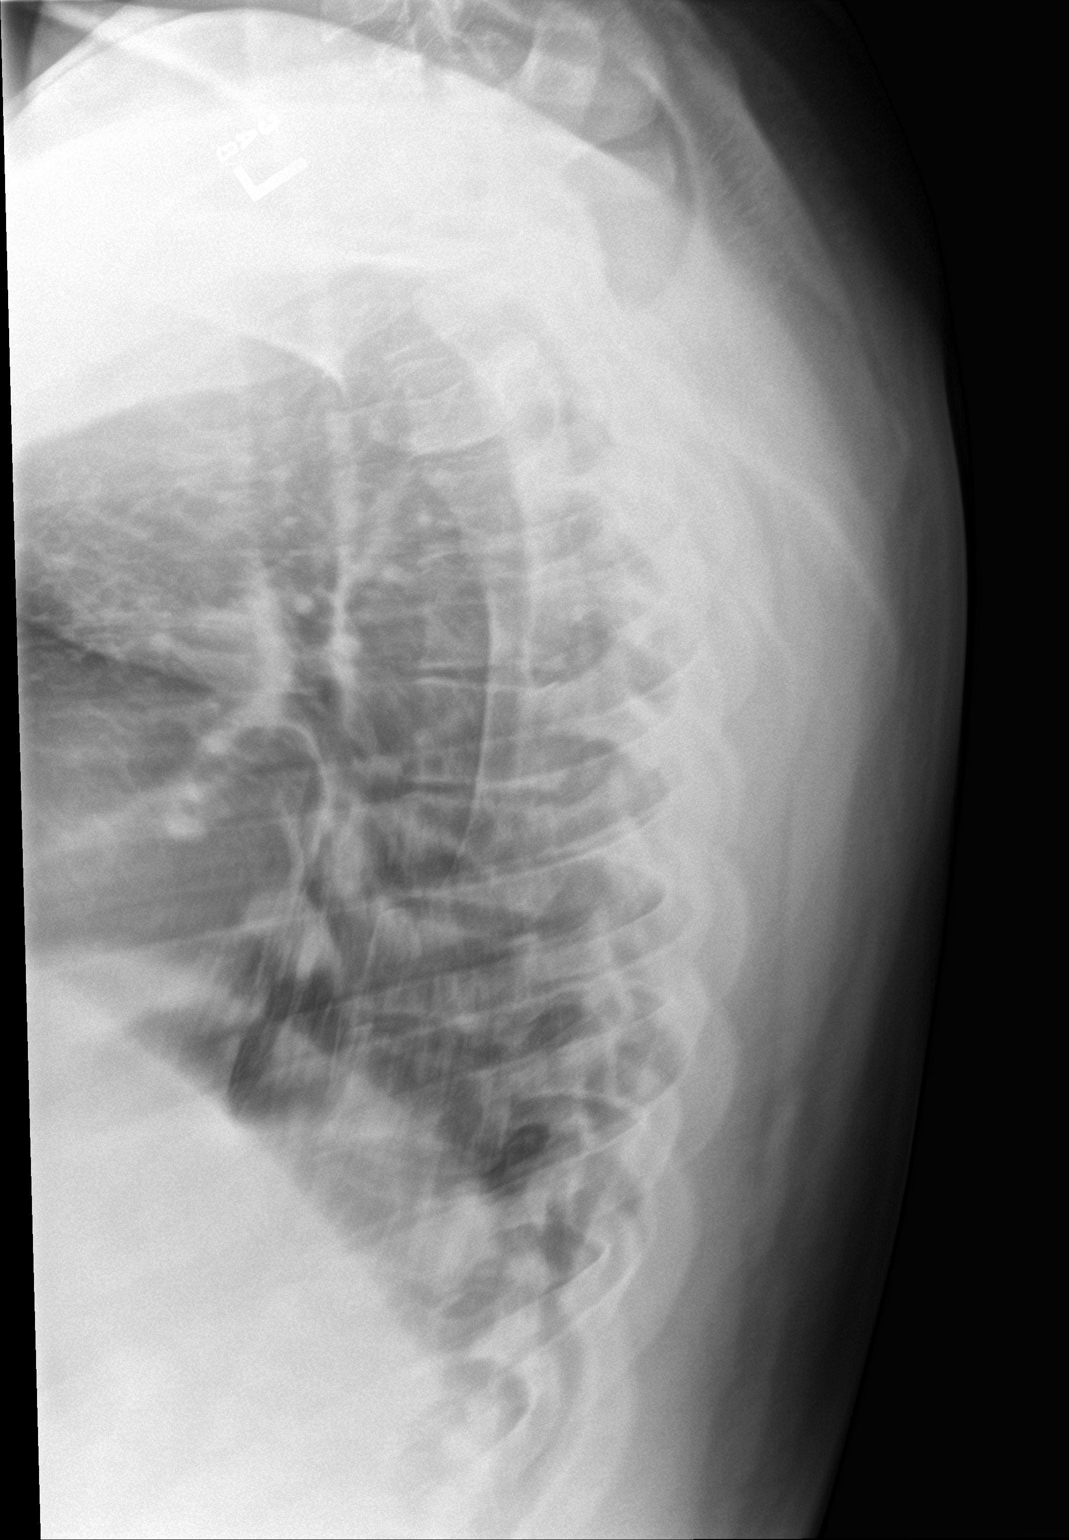

[t-spine swimmers]
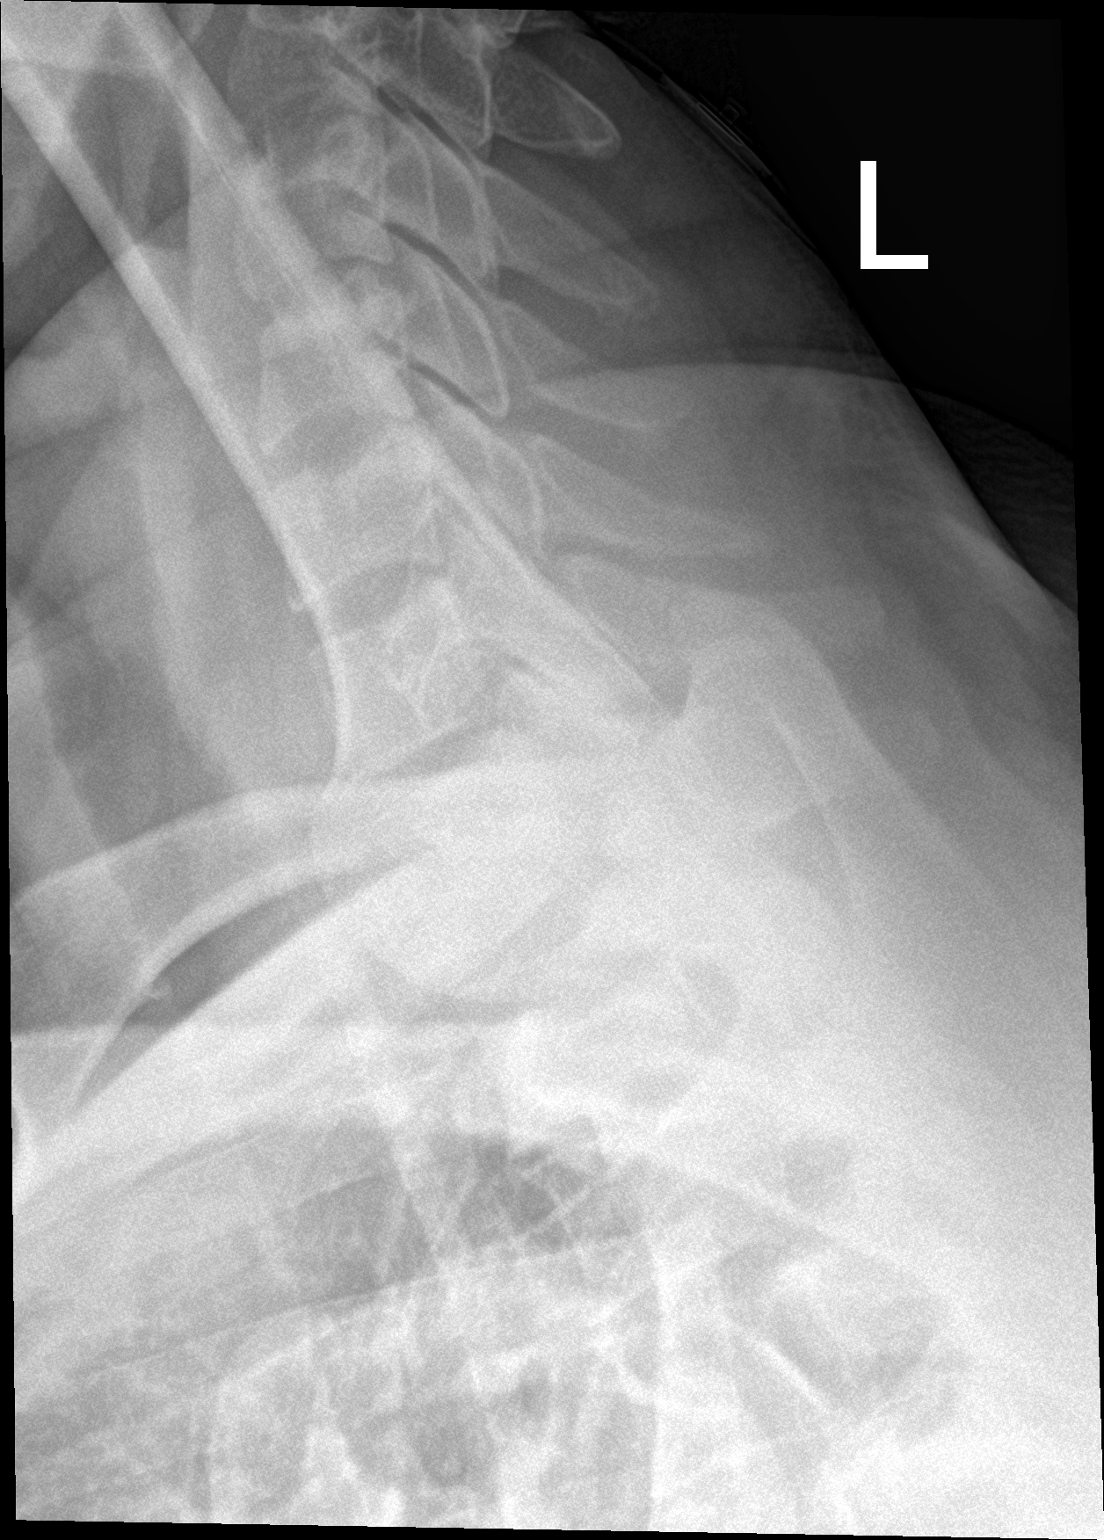

[3 of 3 positions shown; findings below may reference images not displayed]

FINDINGS: There is no evidence of thoracic spine fracture. Alignment is
normal. No other significant bone abnormalities are identified.
IMPRESSION: Negative.

## 2020-08-23 IMAGING — MR MRI THORACIC SPINE WITHOUT CONTRAST
9 of 11 series · 37 of 48 positions shown · non-contrast
Comparison: Thoracic spine radiographs from 10/19/2018

CLINICAL DATA: Popping sensation in the back while lifting. Syncope
and back pain.

EXAM:
MRI THORACIC AND LUMBAR SPINE WITHOUT CONTRAST
TECHNIQUE: Multiplanar and multiecho pulse sequences of the thoracic and lumbar
spine were obtained without intravenous contrast.

[Series 16: T1 · sagittal · 5.0mm · 1.88mm/px · 2 of 9 slices shown (1 of 4)]
[im 1/9]
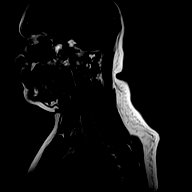
[im 9/9]
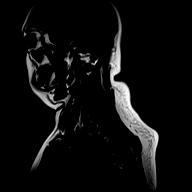

[Series 19: T2 · sagittal · 3.0mm · 1.33mm/px · 3 of 17 slices shown (1 of 4)]
[im 1/17]
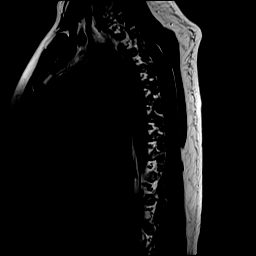
[im 9/17]
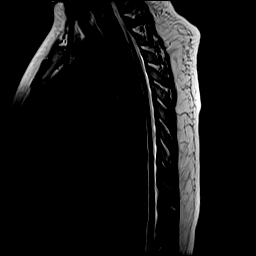
[im 17/17]
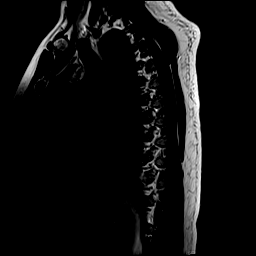

[Series 20: T1 · sagittal · 3.0mm · 1.33mm/px · 3 of 17 slices shown (2 of 4)]
[im 1/17]
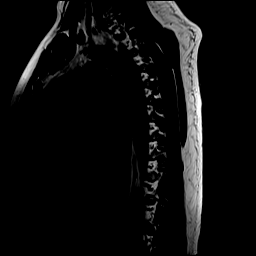
[im 9/17]
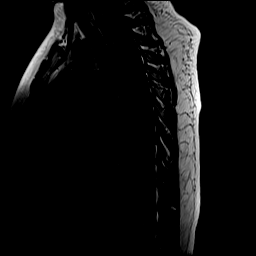
[im 17/17]
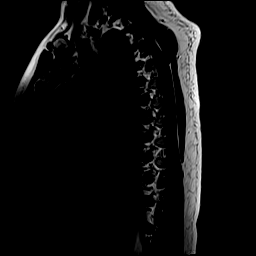

[Series 21: STIR · sagittal · 3.0mm · 0.66mm/px · 2 of 17 slices shown]
[im 1/17]
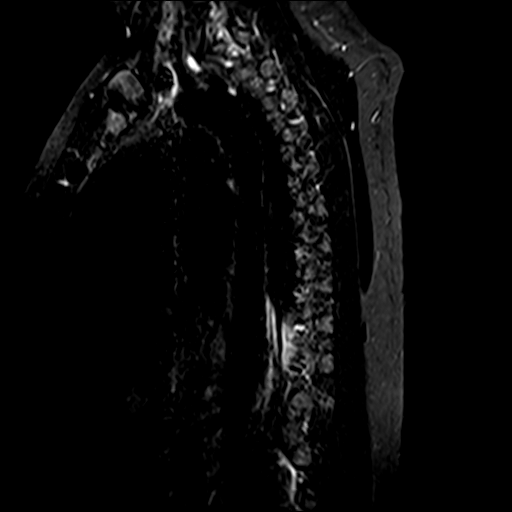
[im 9/17]
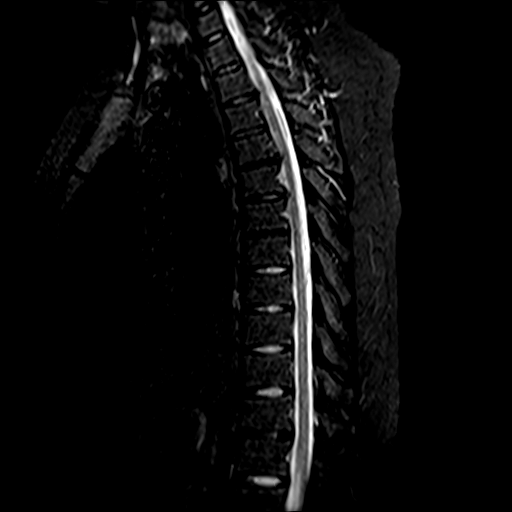

[Series 22: T2 · axial · 4.0mm · 0.59mm/px · z∈[-337,-55]mm · 7 of 39 slices shown (2 of 4)]
[im 1/39]
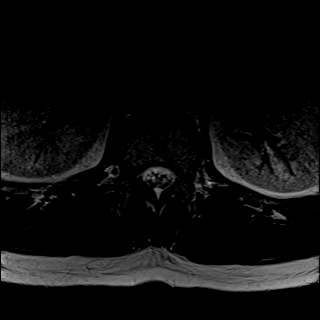
[im 7/39]
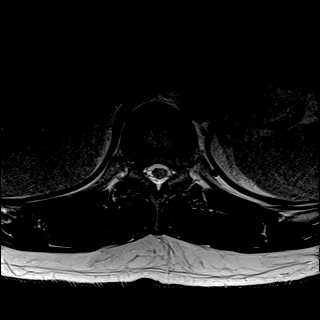
[im 13/39]
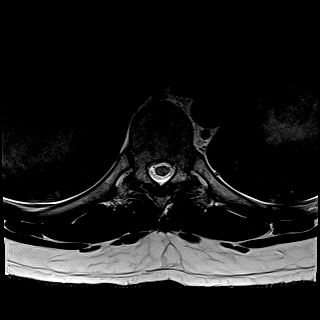
[im 20/39]
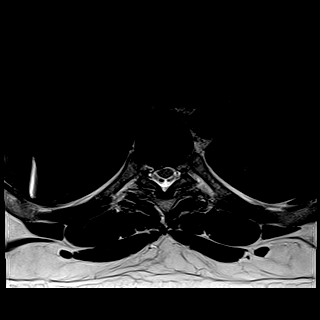
[im 26/39]
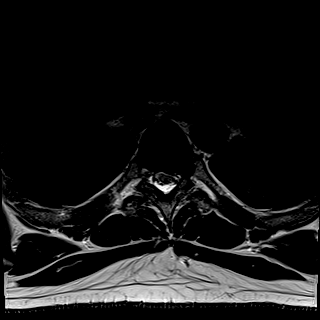
[im 32/39]
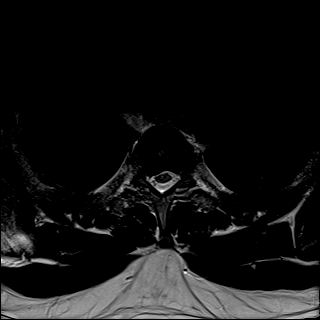
[im 39/39]
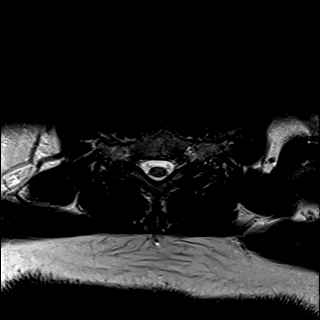

[Series 28: T2 · sagittal · 4.0mm · 1.09mm/px · 3 of 17 slices shown (3 of 4)]
[im 1/17]
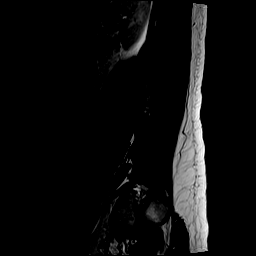
[im 9/17]
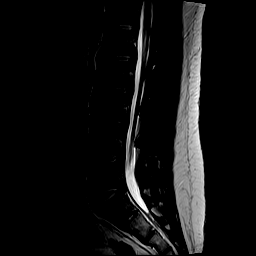
[im 17/17]
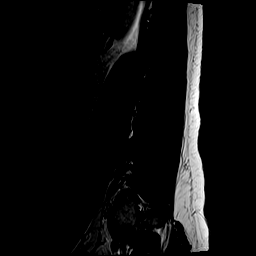

[Series 29: T1 · sagittal · 4.0mm · 1.09mm/px · 3 of 15 slices shown (3 of 4)]
[im 1/15]
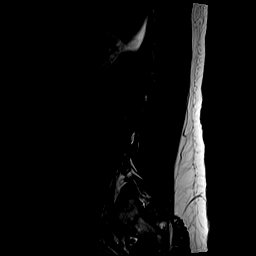
[im 8/15]
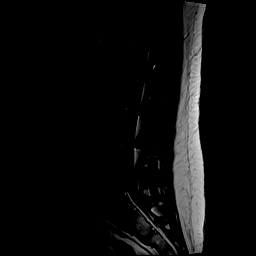
[im 15/15]
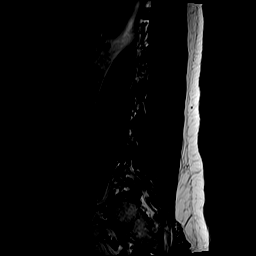

[Series 31: T2 · axial · 4.0mm · 0.78mm/px · z∈[-531,-322]mm · 7 of 39 slices shown (4 of 4)]
[im 1/39]
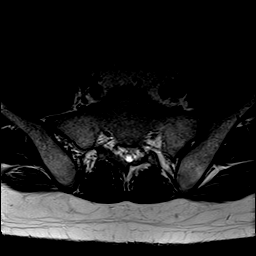
[im 7/39]
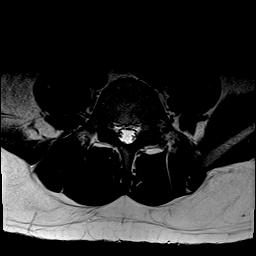
[im 13/39]
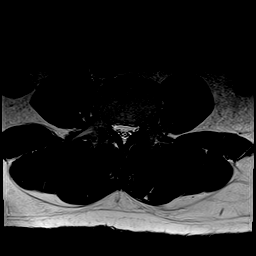
[im 20/39]
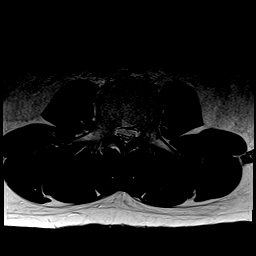
[im 26/39]
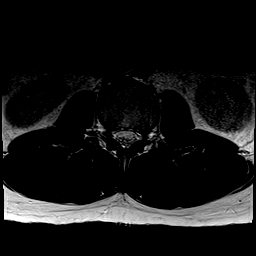
[im 32/39]
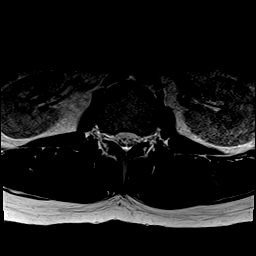
[im 39/39]
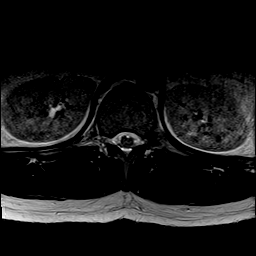

[Series 32: T1 · axial · 4.0mm · 0.39mm/px · z∈[-528,-327]mm · 7 of 36 slices shown (4 of 4)]
[im 1/36]
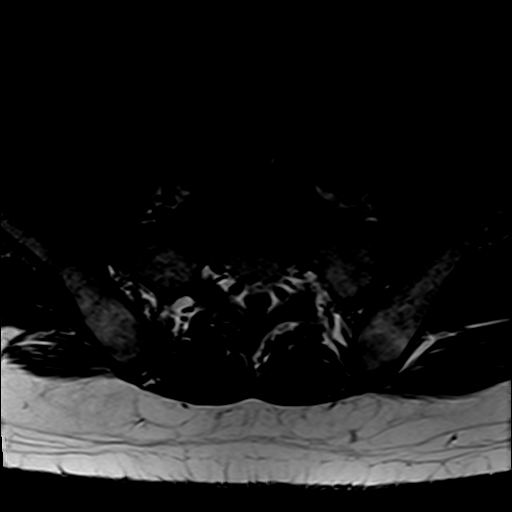
[im 6/36]
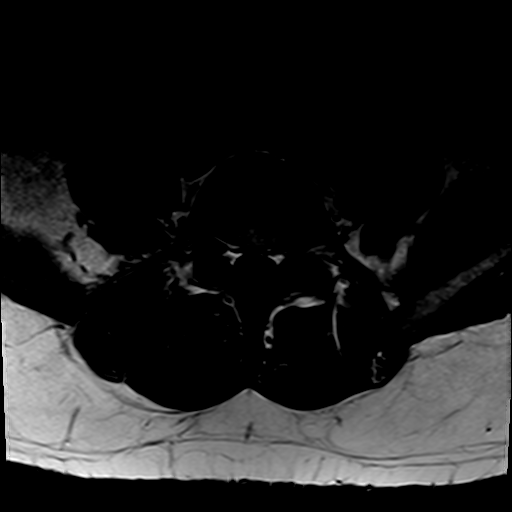
[im 12/36]
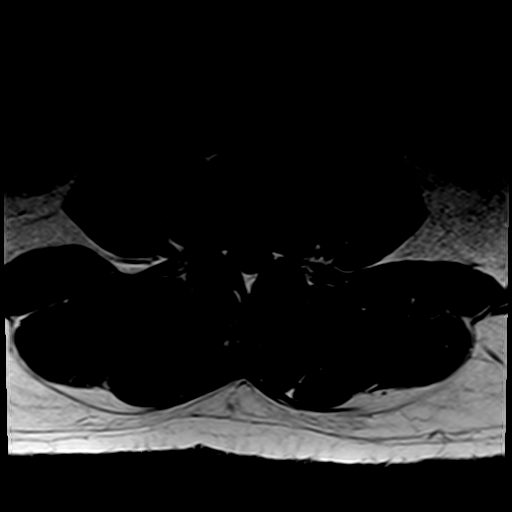
[im 18/36]
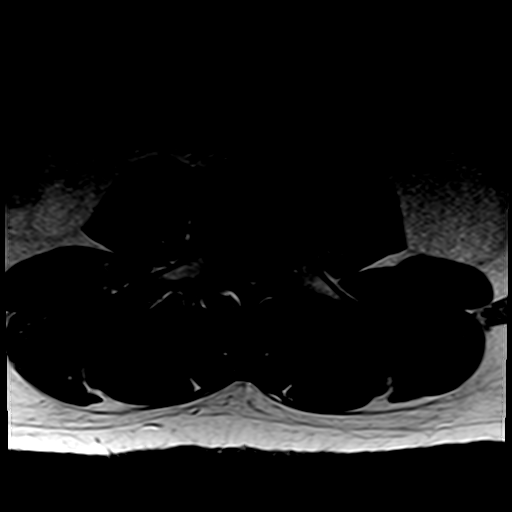
[im 24/36]
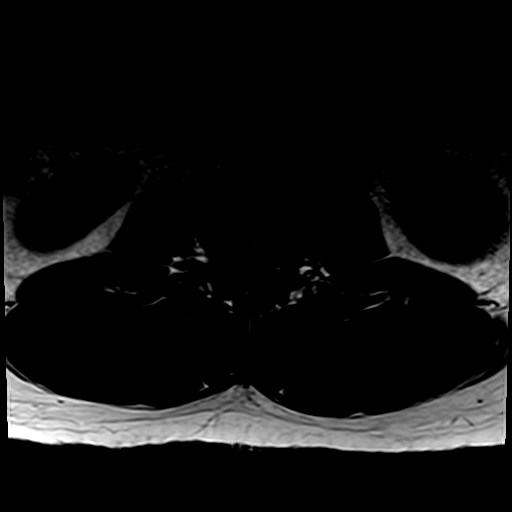
[im 30/36]
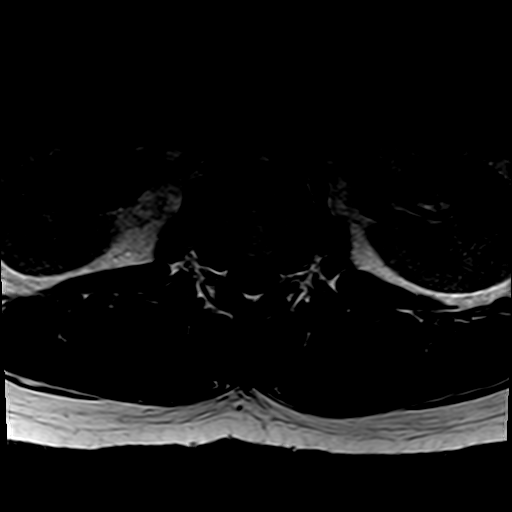
[im 36/36]
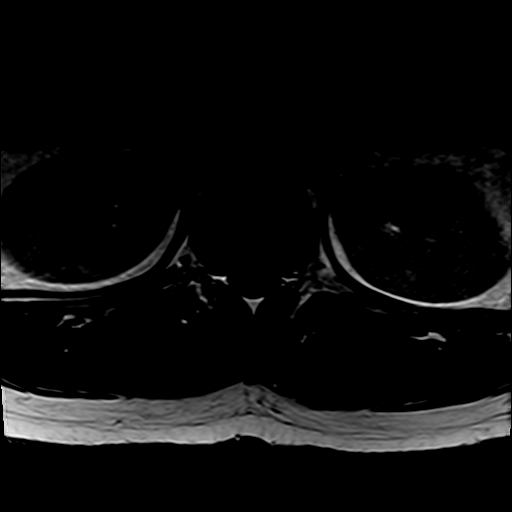

[37 of 48 positions shown; findings below may reference images not displayed]

FINDINGS: MRI THORACIC SPINE FINDINGS

Alignment:  No vertebral subluxation is observed.

Vertebrae: Mild disc desiccation at T4-5 and T11-12.

Cord:  No significant abnormal spinal cord signal is observed.

Paraspinal and other soft tissues: Unremarkable

Disc levels:

T1-2: Unremarkable.

T2-3: No overt impingement although a right paracentral disc
protrusion slightly flattens the ventral margin of the cord.

T3-4: No impingement although a right lateral recess disc protrusion
abuts the right ventral margin of the cord.

T4-5: Borderline central narrowing of the thecal sac due to a left
paracentral disc protrusion which mildly flattens the left anterior
cord. There is plenty of CSF space posterior to the cord.

T5-6: Mild central narrowing of the thecal sac due to a left
paracentral disc protrusion which mildly flattens the left anterior
cord. There is plenty of CSF space posterior to the cord.

T6-7: No impingement. A small right paracentral disc protrusion
abuts the anterior margin of the cord.

T7-8: Unremarkable.

T8-9: No impingement.  Right paracentral disc protrusion.

T9-10: Unremarkable.

T10-11: Unremarkable.

T11-12: No impingement. Broad-based disc bulge with central annular
tear.

T12-L1: Unremarkable.

MRI LUMBAR SPINE FINDINGS

Segmentation: The lowest lumbar type non-rib-bearing vertebra is
labeled as L5.

Alignment:  No vertebral subluxation is observed.

Vertebrae:  Mildly congenitally short pedicles.

Conus medullaris and cauda equina: Conus extends to the L1 level.
Conus and cauda equina appear normal.

Paraspinal and other soft tissues: Unremarkable

Disc levels:

L1-2: Unremarkable.

L2-3: Mild central narrowing of the thecal sac due to disc bulge and
short pedicles.

L3-4: Mild central narrowing of the thecal sac due to disc bulge and
short pedicles.

L4-5: Borderline left foraminal stenosis due to disc bulge and short
pedicles.

L5-S1: Unremarkable.
IMPRESSION: MR THORACIC SPINE IMPRESSION

1. Advanced for age degenerative disc disease with multilevel disc
protrusions. At T5-6 this results in mild central narrowing of the
thecal sac and at T4-5 there is borderline central narrowing of the
thecal sac. Disc protrusions at other thoracic levels do not result
in impingement.

MR LUMBAR SPINE IMPRESSION

1. Degenerative disc disease and congenitally short pedicles lead to
mild central narrowing of the thecal sac at L2-3 and L3-4.

## 2023-11-27 ENCOUNTER — Emergency Department
Admission: EM | Admit: 2023-11-27 | Discharge: 2023-11-27 | Disposition: A | Discharge status: Home / Self Care | Attending: Emergency Medicine | Admitting: Emergency Medicine

## 2023-11-27 ENCOUNTER — Other Ambulatory Visit: Payer: Self-pay

## 2023-11-27 DIAGNOSIS — K602 Anal fissure, unspecified: Secondary | ICD-10-CM | POA: Insufficient documentation

## 2023-11-27 DIAGNOSIS — K5900 Constipation, unspecified: Secondary | ICD-10-CM | POA: Diagnosis present

## 2023-11-27 MED ORDER — GLYCERIN (ADULT) 2 G RE SUPP: 1.0000 | RECTAL | 0 refills | Status: AC | PRN

## 2023-11-27 MED ORDER — LIDOCAINE 5 % EX OINT: 1.0000 | TOPICAL_OINTMENT | CUTANEOUS | 0 refills | Status: AC | PRN

## 2023-11-27 MED ORDER — DOCUSATE SODIUM 100 MG PO CAPS: 100.0000 mg | ORAL_CAPSULE | Freq: Two times a day (BID) | ORAL | 0 refills | Status: AC

## 2023-11-27 NOTE — ED Provider Notes (Signed)
 [facility] Provider Note    Event Date/Time   First MD Initiated Contact with Patient 11/27/23 [phone]     (approximate)   History   Constipation   HPI  Maxwell Franco is a 28 y.o. male no significant past medical history presents to the emergency department for constipation and rectal pain.  States that he has been constipated recently.  Was able to have a bowel movement yesterday.  States that he is having significant pain when having a bowel movement.  Denies nausea, vomiting, fever or chills.  Denies having anal sex.  Denies history of hemorrhoids.  Does not follow regularly with a primary care physician.  Has not tried anything for constipation.  No family history of rectal cancer at a young age.     Physical Exam   Triage Vital Signs: ED Triage Vitals  Encounter Vitals Group     BP 11/27/23 0125 (!) 148/99     Girls Systolic BP Percentile --      Girls Diastolic BP Percentile --      Boys Systolic BP Percentile --      Boys Diastolic BP Percentile --      Pulse Rate 11/27/23 0125 84     Resp 11/27/23 0125 15     Temp 11/27/23 0125 98.2 F (36.8 C)     Temp Source 11/27/23 0125 Oral     SpO2 11/27/23 0125 100 %     Weight 11/27/23 0124 145 lb (65.8 kg)     Height 11/27/23 0124 5' 6 (1.676 m)     Head Circumference --      Peak Flow --      Pain Score 11/27/23 0124 0     Pain Loc --      Pain Education --      Exclude from Growth Chart --     Most recent vital signs: Vitals:   11/27/23 0125  BP: (!) 148/99  Pulse: 84  Resp: 15  Temp: 98.2 F (36.8 C)  SpO2: 100%    Physical Exam Exam conducted with a chaperone present.  Constitutional:      Appearance: He is well-developed.  HENT:     Head: Atraumatic.  Eyes:     Conjunctiva/sclera: Conjunctivae normal.  Cardiovascular:     Rate and Rhythm: Regular rhythm.  Pulmonary:     Effort: No respiratory distress.  Genitourinary:    Rectum: Anal fissure present.      Comments: Anal fissure at the 4 o'clock position.  No external hemorrhoid or internal hemorrhoid noted.  No palpable mass.  No significant tenderness to palpation Musculoskeletal:     Cervical back: Normal range of motion.  Skin:    General: Skin is warm.  Neurological:     Mental Status: He is alert. Mental status is at baseline.      IMPRESSION / MDM / ASSESSMENT AND PLAN / ED COURSE  I reviewed the triage vital signs and the nursing notes.  Differential diagnosis including constipation, hemorrhoids, fissure, fistula, malignancy STI   Labs (all labs ordered are listed, but only abnormal results are displayed) Labs interpreted as -    Labs Reviewed - No data to display  Patient presents to the emergency department with constipation.  On exam does have an appearance of a anal fissure.  No other signs or symptoms concerning for IBD.  No obvious findings consistent with a fistula.  No hemorrhoids noted on exam.  No obvious malignancy.  Denies anal sex and have low suspicion for STI.  Discussed constipation management.  Discussed treatment for anal fissure and need to follow-up closely with primary care physician.  Discussed return to the emergency department for any worsening symptoms.     PROCEDURES:  Critical Care performed: No  Procedures  Patient's presentation is most consistent with acute complicated illness / injury requiring diagnostic workup.   MEDICATIONS ORDERED IN ED: Medications - No data to display  FINAL CLINICAL IMPRESSION(S) / ED DIAGNOSES   Final diagnoses:  Anal fissure  Constipation, unspecified constipation type     Rx / DC Orders   ED Discharge Orders          Ordered    Ambulatory Referral to Primary Care (Establish Care)        11/27/23 0303    glycerin  adult 2 g suppository  As needed        11/27/23 0309    docusate sodium  (COLACE) 100 MG capsule  2 times daily        11/27/23 0309    lidocaine  (XYLOCAINE ) 5 % ointment  As needed         11/27/23 0309             Note:  This document was prepared using Dragon voice recognition software and may include unintentional dictation errors.   Suzanne Kirsch, MD 11/27/23 0330

## 2023-11-27 NOTE — Discharge Instructions (Addendum)
 You were seen in the emergency department for constipation and rectal pain.  You are found to have a small tear near your anus called an anal fissure.  You need to do sitz bath's (at least 3 times a day for 20 minutes, especially after having a bowel movement).  You are given a handout of how to perform sitz bath's.  Have a high-fiber diet, alternate Motrin  and Tylenol  for pain control, start a stool softener and follow-up closely with your primary care provider.  It is important that you stay hydrated and drink plenty of fluids.  Start a stool softener, you will given a prescription for Colace which is also over-the-counter.  It is importantly you have a high-fiber diet -you were given a list of high-fiber diet foods.  You can also try Citrucel (over the counter) 1 TBS in water 1-3 times a day.  You are given a prescription for lidocaine  ointment which can help with the pain.    For constipation you are given a prescription for glycerin  suppositories.  Leave it in for 30 to 60 minutes for constipation.  You can also use over-the-counter MiraLAX.  take MiraLAX (4 capfulls) mixed with 32-64 ounces of fluid.  Drink this entire drink.  If you do not have a bowel movement that day you can repeat the next day and add 2 capfuls for a total of 6 capfuls of MiraLAX.  MiraLAX does not work if you do not drink plenty of fluids with it. Once you have a good bowel movement, take 1 capfull of Miralax daily until having regular bowel movements.  Pain control:  Ibuprofen  (motrin /aleve /advil ) - You can take 3 tablets (600 mg) every 6 hours as needed for pain/fever.  Acetaminophen  (tylenol ) - You can take 2 extra strength tablets (1000 mg) every 6 hours as needed for pain/fever.  You can alternate these medications or take them together.  Make sure you eat food/drink water when taking these medications.

## 2023-11-27 NOTE — ED Notes (Signed)
 Pt discharged to ED circle at this time and left with all belongings. Pt ABCs intact. RR even and unlabored. Pt in NAD. Pt denies further needs from this RN.

## 2023-11-27 NOTE — ED Triage Notes (Signed)
 Pt to ED via POV c/o constipation. Last BM was yesterday. Pt states he has to strain to use bathroom.

## 2023-11-27 NOTE — ED Notes (Addendum)
 Pt reporting to ED d/t constipation for the last week. Pt reports anal pain when straining and thinks he may have hemorrhoids. Pt denies having any nausea or vomiting. Pt last BM was yesterday around 2200, pt says stool looked normal but was just painful to get out. Pt reports painful urination. Pt having difficulty with constant stream. Unsure of foul odors with urination. Urine described as dark. Pt denies drainage from penis. Pt ABCs intact. RR even and unlabored. Pt in NAD. Bed in lowest locked position. Call bell in reach. Denies needs at this time.    Past Medical History:  Diagnosis Date   Gastroesophageal reflux

## 2023-11-27 NOTE — ED Notes (Signed)
RN at bedside to chaperone rectal exam
